# Patient Record
Sex: Female | Born: 1985 | Race: Black or African American | Hispanic: No | Marital: Single
Health system: Southern US, Community
[De-identification: ages and names within clinical notes are randomized; demographics above are authoritative.]

## PROBLEM LIST (undated history)

## (undated) DIAGNOSIS — I82409 Acute embolism and thrombosis of unspecified deep veins of unspecified lower extremity: Secondary | ICD-10-CM

## (undated) DIAGNOSIS — N390 Urinary tract infection, site not specified: Secondary | ICD-10-CM

## (undated) DIAGNOSIS — F259 Schizoaffective disorder, unspecified: Secondary | ICD-10-CM

## (undated) DIAGNOSIS — A419 Sepsis, unspecified organism: Secondary | ICD-10-CM

## (undated) DIAGNOSIS — I513 Intracardiac thrombosis, not elsewhere classified: Secondary | ICD-10-CM

## (undated) DIAGNOSIS — G9340 Encephalopathy, unspecified: Secondary | ICD-10-CM

## (undated) DIAGNOSIS — D649 Anemia, unspecified: Secondary | ICD-10-CM

## (undated) DIAGNOSIS — E119 Type 2 diabetes mellitus without complications: Secondary | ICD-10-CM

## (undated) HISTORY — DX: Urinary tract infection, site not specified: N39.0

## (undated) HISTORY — DX: Anemia, unspecified: D64.9

## (undated) HISTORY — DX: Schizoaffective disorder, unspecified: F25.9

## (undated) HISTORY — DX: Sepsis, unspecified organism: A41.9

## (undated) HISTORY — DX: Encephalopathy, unspecified: G93.40

## (undated) HISTORY — DX: Type 2 diabetes mellitus without complications: E11.9

## (undated) HISTORY — DX: Intracardiac thrombosis, not elsewhere classified: I51.3

## (undated) HISTORY — DX: Acute embolism and thrombosis of unspecified deep veins of unspecified lower extremity: I82.409

---

## 2014-01-07 ENCOUNTER — Inpatient Hospital Stay: Payer: Self-pay | Admitting: Internal Medicine

## 2014-01-07 LAB — COMPREHENSIVE METABOLIC PANEL
ANION GAP: 8 (ref 7–16)
Albumin: 2.6 g/dL — ABNORMAL LOW (ref 3.4–5.0)
Alkaline Phosphatase: 338 U/L — ABNORMAL HIGH
BUN: 18 mg/dL (ref 7–18)
Bilirubin,Total: 0.4 mg/dL (ref 0.2–1.0)
CALCIUM: 10.3 mg/dL — AB (ref 8.5–10.1)
CHLORIDE: 106 mmol/L (ref 98–107)
Co2: 22 mmol/L (ref 21–32)
Creatinine: 0.85 mg/dL (ref 0.60–1.30)
Glucose: 131 mg/dL — ABNORMAL HIGH (ref 65–99)
Osmolality: 276 (ref 275–301)
Potassium: 3.9 mmol/L (ref 3.5–5.1)
SGOT(AST): 71 U/L — ABNORMAL HIGH (ref 15–37)
SGPT (ALT): 64 U/L (ref 12–78)
SODIUM: 136 mmol/L (ref 136–145)
TOTAL PROTEIN: 8.9 g/dL — AB (ref 6.4–8.2)

## 2014-01-07 LAB — SALICYLATE LEVEL: Salicylates, Serum: <1.7 mg/dL

## 2014-01-07 LAB — CBC
HCT: 20.6 % — ABNORMAL LOW (ref 35.0–47.0)
HGB: 6 g/dL — ABNORMAL LOW (ref 12.0–16.0)
MCH: 19.5 pg — ABNORMAL LOW (ref 26.0–34.0)
MCHC: 28.9 g/dL — AB (ref 32.0–36.0)
MCV: 67 fL — AB (ref 80–100)
PLATELETS: 570 10*3/uL — AB (ref 150–440)
RBC: 3.07 10*6/uL — ABNORMAL LOW (ref 3.80–5.20)
RDW: 25.9 % — ABNORMAL HIGH (ref 11.5–14.5)
WBC: 20.4 10*3/uL — AB (ref 3.6–11.0)

## 2014-01-07 LAB — URINALYSIS, COMPLETE
Bacteria: NONE SEEN
Bilirubin,UR: NEGATIVE
Glucose,UR: NEGATIVE mg/dL (ref 0–75)
Ketone: NEGATIVE
NITRITE: NEGATIVE
PH: 6 (ref 4.5–8.0)
RBC,UR: 8 /HPF (ref 0–5)
Specific Gravity: 1.015 (ref 1.003–1.030)
WBC UR: 69 /HPF (ref 0–5)

## 2014-01-07 LAB — DRUG SCREEN, URINE

## 2014-01-07 LAB — ACETAMINOPHEN LEVEL: Acetaminophen: <2 ug/mL

## 2014-01-07 LAB — PREGNANCY, URINE: PREGNANCY TEST, URINE: NEGATIVE m[IU]/mL

## 2014-01-07 LAB — ETHANOL: Ethanol: <3 mg/dL

## 2014-01-07 LAB — LITHIUM LEVEL: Lithium: 1.21 mmol/L — ABNORMAL HIGH

## 2014-01-08 LAB — CBC WITH DIFFERENTIAL/PLATELET
BASOS PCT: 0.3 %
Basophil #: 0 10*3/uL (ref 0.0–0.1)
EOS PCT: 1.9 %
Eosinophil #: 0.3 10*3/uL (ref 0.0–0.7)
HCT: 24.2 % — AB (ref 35.0–47.0)
HGB: 7.5 g/dL — AB (ref 12.0–16.0)
LYMPHS ABS: 2.7 10*3/uL (ref 1.0–3.6)
LYMPHS PCT: 17.2 %
MCH: 22.4 pg — ABNORMAL LOW (ref 26.0–34.0)
MCHC: 31.2 g/dL — AB (ref 32.0–36.0)
MCV: 72 fL — ABNORMAL LOW (ref 80–100)
MONO ABS: 0.8 x10 3/mm (ref 0.2–0.9)
MONOS PCT: 5.4 %
NEUTROS PCT: 75.2 %
Neutrophil #: 11.6 10*3/uL — ABNORMAL HIGH (ref 1.4–6.5)
Platelet: 365 10*3/uL (ref 150–440)
RBC: 3.36 10*6/uL — AB (ref 3.80–5.20)
RDW: 26.7 % — ABNORMAL HIGH (ref 11.5–14.5)
WBC: 15.5 10*3/uL — ABNORMAL HIGH (ref 3.6–11.0)

## 2014-01-08 LAB — IRON AND TIBC
Iron Bind.Cap.(Total): 368 ug/dL (ref 250–450)
Iron Saturation: 7 %
Iron: 25 ug/dL — ABNORMAL LOW (ref 50–170)
UNBOUND IRON-BIND. CAP.: 343 ug/dL

## 2014-01-08 LAB — BASIC METABOLIC PANEL
Anion Gap: 7 (ref 7–16)
BUN: 14 mg/dL (ref 7–18)
CALCIUM: 9.8 mg/dL (ref 8.5–10.1)
Chloride: 108 mmol/L — ABNORMAL HIGH (ref 98–107)
Co2: 22 mmol/L (ref 21–32)
Creatinine: 0.67 mg/dL (ref 0.60–1.30)
GLUCOSE: 135 mg/dL — AB (ref 65–99)
Osmolality: 276 (ref 275–301)
POTASSIUM: 3.6 mmol/L (ref 3.5–5.1)
SODIUM: 137 mmol/L (ref 136–145)

## 2014-01-08 LAB — SEDIMENTATION RATE: Erythrocyte Sed Rate: >140 mm/hr — ABNORMAL HIGH (ref 0–20)

## 2014-01-09 LAB — BASIC METABOLIC PANEL
ANION GAP: 6 — AB (ref 7–16)
BUN: 9 mg/dL (ref 7–18)
CHLORIDE: 108 mmol/L — AB (ref 98–107)
CO2: 20 mmol/L — AB (ref 21–32)
Calcium, Total: 9.4 mg/dL (ref 8.5–10.1)
Creatinine: 0.69 mg/dL (ref 0.60–1.30)
EGFR (African American): >60
EGFR (Non-African Amer.): >60
GLUCOSE: 136 mg/dL — AB (ref 65–99)
OSMOLALITY: 269 (ref 275–301)
POTASSIUM: 3.7 mmol/L (ref 3.5–5.1)
SODIUM: 134 mmol/L — AB (ref 136–145)

## 2014-01-09 LAB — LITHIUM LEVEL: Lithium: 0.52 mmol/L — ABNORMAL LOW

## 2014-01-09 LAB — CLOSTRIDIUM DIFFICILE(ARMC)

## 2014-01-09 LAB — OCCULT BLOOD X 1 CARD TO LAB, STOOL: Occult Blood, Feces: NEGATIVE

## 2014-01-09 LAB — HEPATIC FUNCTION PANEL A (ARMC)
ALK PHOS: 234 U/L — AB
Albumin: 2.1 g/dL — ABNORMAL LOW (ref 3.4–5.0)
Bilirubin,Total: 0.2 mg/dL (ref 0.2–1.0)
SGOT(AST): 39 U/L — ABNORMAL HIGH (ref 15–37)
SGPT (ALT): 72 U/L (ref 12–78)
Total Protein: 7.1 g/dL (ref 6.4–8.2)

## 2014-01-09 LAB — CBC WITH DIFFERENTIAL/PLATELET
BASOS PCT: 0.3 %
Basophil #: 0.1 10*3/uL (ref 0.0–0.1)
Eosinophil #: 0.3 10*3/uL (ref 0.0–0.7)
Eosinophil %: 1.9 %
HCT: 22.3 % — ABNORMAL LOW (ref 35.0–47.0)
HGB: 6.8 g/dL — AB (ref 12.0–16.0)
LYMPHS ABS: 2.6 10*3/uL (ref 1.0–3.6)
Lymphocyte %: 16.9 %
MCH: 22.1 pg — AB (ref 26.0–34.0)
MCHC: 30.7 g/dL — ABNORMAL LOW (ref 32.0–36.0)
MCV: 72 fL — AB (ref 80–100)
MONOS PCT: 6.7 %
Monocyte #: 1 x10 3/mm — ABNORMAL HIGH (ref 0.2–0.9)
Neutrophil #: 11.4 10*3/uL — ABNORMAL HIGH (ref 1.4–6.5)
Neutrophil %: 74.2 %
Platelet: 283 10*3/uL (ref 150–440)
RBC: 3.09 10*6/uL — AB (ref 3.80–5.20)
RDW: 25.8 % — ABNORMAL HIGH (ref 11.5–14.5)
WBC: 15.4 10*3/uL — AB (ref 3.6–11.0)

## 2014-01-10 LAB — CBC WITH DIFFERENTIAL/PLATELET
Basophil #: 0.1 10*3/uL (ref 0.0–0.1)
Basophil %: 0.6 %
EOS ABS: 0.2 10*3/uL (ref 0.0–0.7)
Eosinophil %: 1.4 %
HCT: 25.9 % — AB (ref 35.0–47.0)
HGB: 8 g/dL — ABNORMAL LOW (ref 12.0–16.0)
LYMPHS ABS: 2.7 10*3/uL (ref 1.0–3.6)
LYMPHS PCT: 18.8 %
MCH: 22.9 pg — AB (ref 26.0–34.0)
MCHC: 31.1 g/dL — AB (ref 32.0–36.0)
MCV: 74 fL — AB (ref 80–100)
MONO ABS: 1 x10 3/mm — AB (ref 0.2–0.9)
MONOS PCT: 7.3 %
Neutrophil #: 10.3 10*3/uL — ABNORMAL HIGH (ref 1.4–6.5)
Neutrophil %: 71.9 %
PLATELETS: 295 10*3/uL (ref 150–440)
RBC: 3.51 10*6/uL — ABNORMAL LOW (ref 3.80–5.20)
RDW: 25.7 % — ABNORMAL HIGH (ref 11.5–14.5)
WBC: 14.3 10*3/uL — AB (ref 3.6–11.0)

## 2014-01-10 LAB — CK: CK, Total: 40 U/L

## 2014-01-10 LAB — HEPATIC FUNCTION PANEL A (ARMC)
ALBUMIN: 2.2 g/dL — AB (ref 3.4–5.0)
AST: 77 U/L — AB (ref 15–37)
Alkaline Phosphatase: 222 U/L — ABNORMAL HIGH
BILIRUBIN TOTAL: 0.2 mg/dL (ref 0.2–1.0)
Bilirubin, Direct: <0.1 mg/dL (ref 0.00–0.20)
SGPT (ALT): 94 U/L — ABNORMAL HIGH (ref 12–78)
Total Protein: 7.3 g/dL (ref 6.4–8.2)

## 2014-01-10 LAB — RETICULOCYTES
ABSOLUTE RETIC COUNT: 0.128 10*6/uL (ref 0.019–0.186)
RETICULOCYTE: 2.9 % (ref 0.4–3.1)

## 2014-01-10 LAB — LACTATE DEHYDROGENASE: LDH: 331 U/L — ABNORMAL HIGH (ref 81–246)

## 2014-01-10 LAB — HCG, QUANTITATIVE, PREGNANCY

## 2014-01-10 LAB — AMMONIA: AMMONIA, PLASMA: 34 umol/L — AB (ref 11–32)

## 2014-01-11 DIAGNOSIS — R000 Tachycardia, unspecified: Secondary | ICD-10-CM

## 2014-01-11 LAB — TSH: Thyroid Stimulating Horm: 2.59 u[IU]/mL

## 2014-01-11 LAB — RETICULOCYTES
Absolute Retic Count: 0.125 10*6/uL (ref 0.019–0.186)
Reticulocyte: 3.7 % — ABNORMAL HIGH (ref 0.4–3.1)

## 2014-01-11 LAB — STOOL CULTURE

## 2014-01-11 LAB — AMMONIA: AMMONIA, PLASMA: 25 umol/L (ref 11–32)

## 2014-01-12 DIAGNOSIS — I514 Myocarditis, unspecified: Secondary | ICD-10-CM

## 2014-01-12 LAB — URINALYSIS, COMPLETE
Bilirubin,UR: NEGATIVE
Glucose,UR: NEGATIVE mg/dL (ref 0–75)
Ketone: NEGATIVE
Nitrite: NEGATIVE
PROTEIN: NEGATIVE
Ph: 7 (ref 4.5–8.0)
RBC,UR: 2 /HPF (ref 0–5)
Specific Gravity: 1.001 (ref 1.003–1.030)
Squamous Epithelial: <1
WBC UR: 29 /HPF (ref 0–5)

## 2014-01-12 LAB — CREATININE, SERUM
Creatinine: 0.73 mg/dL (ref 0.60–1.30)
EGFR (African American): >60
EGFR (Non-African Amer.): >60

## 2014-01-12 LAB — PROTIME-INR
INR: 1.2
PROTHROMBIN TIME: 14.9 s — AB (ref 11.5–14.7)

## 2014-01-13 DIAGNOSIS — I38 Endocarditis, valve unspecified: Secondary | ICD-10-CM

## 2014-01-13 LAB — CBC WITH DIFFERENTIAL/PLATELET
BASOS ABS: 0 10*3/uL (ref 0.0–0.1)
Basophil %: 0.4 %
Eosinophil #: 0.2 10*3/uL (ref 0.0–0.7)
Eosinophil %: 2.4 %
HCT: 23.5 % — ABNORMAL LOW (ref 35.0–47.0)
HGB: 7.5 g/dL — AB (ref 12.0–16.0)
Lymphocyte #: 2.8 10*3/uL (ref 1.0–3.6)
Lymphocyte %: 27.8 %
MCH: 23.5 pg — ABNORMAL LOW (ref 26.0–34.0)
MCHC: 32 g/dL (ref 32.0–36.0)
MCV: 74 fL — AB (ref 80–100)
MONO ABS: 0.9 x10 3/mm (ref 0.2–0.9)
Monocyte %: 8.8 %
Neutrophil #: 6 10*3/uL (ref 1.4–6.5)
Neutrophil %: 60.6 %
PLATELETS: 453 10*3/uL — AB (ref 150–440)
RBC: 3.2 10*6/uL — ABNORMAL LOW (ref 3.80–5.20)
RDW: 25.7 % — AB (ref 11.5–14.5)
WBC: 9.9 10*3/uL (ref 3.6–11.0)

## 2014-01-13 LAB — LITHIUM LEVEL: LITHIUM: 0.88 mmol/L

## 2014-01-13 LAB — PROTIME-INR
INR: 1.3
PROTHROMBIN TIME: 15.7 s — AB (ref 11.5–14.7)

## 2014-01-14 LAB — BASIC METABOLIC PANEL
Anion Gap: 8 (ref 7–16)
BUN: 5 mg/dL — AB (ref 7–18)
CALCIUM: 9.9 mg/dL (ref 8.5–10.1)
CREATININE: 0.73 mg/dL (ref 0.60–1.30)
Chloride: 107 mmol/L (ref 98–107)
Co2: 23 mmol/L (ref 21–32)
EGFR (African American): >60
Glucose: 101 mg/dL — ABNORMAL HIGH (ref 65–99)
Osmolality: 273 (ref 275–301)
Potassium: 4 mmol/L (ref 3.5–5.1)
Sodium: 138 mmol/L (ref 136–145)

## 2014-01-14 LAB — MAGNESIUM: Magnesium: 2 mg/dL

## 2014-01-14 LAB — PROTIME-INR
INR: 1.4
Prothrombin Time: 16.9 secs — ABNORMAL HIGH (ref 11.5–14.7)

## 2014-01-14 LAB — HEMOGLOBIN: HGB: 7.3 g/dL — ABNORMAL LOW (ref 12.0–16.0)

## 2014-01-15 LAB — PROT IMMUNOELECTROPHORES(ARMC)

## 2014-01-15 LAB — URINE CULTURE

## 2014-01-15 LAB — PROTIME-INR
INR: 1.7
Prothrombin Time: 19.7 secs — ABNORMAL HIGH (ref 11.5–14.7)

## 2014-01-15 LAB — HEMOGLOBIN: HGB: 7.4 g/dL — ABNORMAL LOW (ref 12.0–16.0)

## 2014-01-16 LAB — PROTIME-INR
INR: 2.5
Prothrombin Time: 26.3 secs — ABNORMAL HIGH (ref 11.5–14.7)

## 2014-01-22 ENCOUNTER — Telehealth: Payer: Self-pay | Admitting: *Deleted

## 2014-01-22 ENCOUNTER — Encounter: Payer: Self-pay | Admitting: *Deleted

## 2014-01-22 NOTE — Telephone Encounter (Signed)
Attempted to contact patient for TCM call  No phone number available

## 2014-01-29 ENCOUNTER — Encounter: Payer: Self-pay | Admitting: *Deleted

## 2014-01-29 ENCOUNTER — Encounter: Payer: Self-pay | Admitting: Cardiovascular Disease

## 2014-01-31 ENCOUNTER — Emergency Department: Payer: Self-pay | Admitting: Emergency Medicine

## 2014-01-31 LAB — CBC WITH DIFFERENTIAL/PLATELET
BASOS PCT: 0.4 %
Basophil #: 0.1 10*3/uL (ref 0.0–0.1)
EOS PCT: 0.4 %
Eosinophil #: 0.1 10*3/uL (ref 0.0–0.7)
HCT: 12.1 % — CL (ref 35.0–47.0)
HGB: 3.6 g/dL — AB (ref 12.0–16.0)
LYMPHS ABS: 3.6 10*3/uL (ref 1.0–3.6)
Lymphocyte %: 22.2 %
MCH: 23.4 pg — ABNORMAL LOW (ref 26.0–34.0)
MCHC: 29.7 g/dL — ABNORMAL LOW (ref 32.0–36.0)
MCV: 79 fL — ABNORMAL LOW (ref 80–100)
Monocyte #: 1.8 x10 3/mm — ABNORMAL HIGH (ref 0.2–0.9)
Monocyte %: 11 %
NEUTROS ABS: 10.6 10*3/uL — AB (ref 1.4–6.5)
NEUTROS PCT: 66 %
Platelet: 528 10*3/uL — ABNORMAL HIGH (ref 150–440)
RBC: 1.54 10*6/uL — ABNORMAL LOW (ref 3.80–5.20)
RDW: 28.5 % — ABNORMAL HIGH (ref 11.5–14.5)
WBC: 16.1 10*3/uL — ABNORMAL HIGH (ref 3.6–11.0)

## 2014-01-31 LAB — COMPREHENSIVE METABOLIC PANEL
ALBUMIN: 1.9 g/dL — AB (ref 3.4–5.0)
ANION GAP: 14 (ref 7–16)
Alkaline Phosphatase: 87 U/L
BUN: 6 mg/dL — ABNORMAL LOW (ref 7–18)
Bilirubin,Total: 0.1 mg/dL — ABNORMAL LOW (ref 0.2–1.0)
CHLORIDE: 105 mmol/L (ref 98–107)
CREATININE: 0.93 mg/dL (ref 0.60–1.30)
Calcium, Total: 8.7 mg/dL (ref 8.5–10.1)
Co2: 15 mmol/L — ABNORMAL LOW (ref 21–32)
EGFR (African American): >60
EGFR (Non-African Amer.): >60
GLUCOSE: 103 mg/dL — AB (ref 65–99)
OSMOLALITY: 266 (ref 275–301)
Potassium: 4.2 mmol/L (ref 3.5–5.1)
SGOT(AST): 26 U/L (ref 15–37)
SGPT (ALT): 14 U/L (ref 12–78)
Sodium: 134 mmol/L — ABNORMAL LOW (ref 136–145)
TOTAL PROTEIN: 4.7 g/dL — AB (ref 6.4–8.2)

## 2014-01-31 LAB — PROTIME-INR
INR: 3.4
PROTHROMBIN TIME: 33.1 s — AB (ref 11.5–14.7)

## 2014-01-31 LAB — HCG, QUANTITATIVE, PREGNANCY

## 2014-02-05 LAB — CULTURE, BLOOD (SINGLE)

## 2014-02-07 LAB — CULTURE, BLOOD (SINGLE)

## 2014-04-18 ENCOUNTER — Emergency Department: Payer: Self-pay | Admitting: Emergency Medicine

## 2014-04-18 LAB — DRUG SCREEN, URINE

## 2014-04-18 LAB — COMPREHENSIVE METABOLIC PANEL
ALT: 36 U/L
ANION GAP: 7 (ref 7–16)
AST: 27 U/L (ref 15–37)
Albumin: 3.8 g/dL (ref 3.4–5.0)
Alkaline Phosphatase: 107 U/L
BUN: 6 mg/dL — ABNORMAL LOW (ref 7–18)
Bilirubin,Total: 0.3 mg/dL (ref 0.2–1.0)
Calcium, Total: 9.9 mg/dL (ref 8.5–10.1)
Chloride: 107 mmol/L (ref 98–107)
Co2: 24 mmol/L (ref 21–32)
Creatinine: 0.82 mg/dL (ref 0.60–1.30)
EGFR (African American): >60
EGFR (Non-African Amer.): >60
Glucose: 92 mg/dL (ref 65–99)
Osmolality: 273 (ref 275–301)
Potassium: 3.6 mmol/L (ref 3.5–5.1)
SODIUM: 138 mmol/L (ref 136–145)
TOTAL PROTEIN: 8 g/dL (ref 6.4–8.2)

## 2014-04-18 LAB — SALICYLATE LEVEL

## 2014-04-18 LAB — URINALYSIS, COMPLETE
BILIRUBIN, UR: NEGATIVE
BLOOD: NEGATIVE
Glucose,UR: NEGATIVE mg/dL (ref 0–75)
Hyaline Cast: 2
Ketone: NEGATIVE
Nitrite: NEGATIVE
Ph: 6 (ref 4.5–8.0)
Protein: 100
RBC,UR: 6 /HPF (ref 0–5)
Specific Gravity: 1.015 (ref 1.003–1.030)
Squamous Epithelial: 7
WBC UR: 15 /HPF (ref 0–5)

## 2014-04-18 LAB — CBC WITH DIFFERENTIAL/PLATELET
Basophil #: 0 10*3/uL (ref 0.0–0.1)
Basophil %: 0.6 %
EOS ABS: 0.1 10*3/uL (ref 0.0–0.7)
Eosinophil %: 1.8 %
HCT: 33.4 % — ABNORMAL LOW (ref 35.0–47.0)
HGB: 11.1 g/dL — ABNORMAL LOW (ref 12.0–16.0)
Lymphocyte #: 1.4 10*3/uL (ref 1.0–3.6)
Lymphocyte %: 21.9 %
MCH: 30.3 pg (ref 26.0–34.0)
MCHC: 33.3 g/dL (ref 32.0–36.0)
MCV: 91 fL (ref 80–100)
MONOS PCT: 12.1 %
Monocyte #: 0.8 x10 3/mm (ref 0.2–0.9)
NEUTROS ABS: 4.2 10*3/uL (ref 1.4–6.5)
Neutrophil %: 63.6 %
Platelet: 478 10*3/uL — ABNORMAL HIGH (ref 150–440)
RBC: 3.67 10*6/uL — AB (ref 3.80–5.20)
RDW: 15.6 % — ABNORMAL HIGH (ref 11.5–14.5)
WBC: 6.5 10*3/uL (ref 3.6–11.0)

## 2014-04-18 LAB — PROTIME-INR
INR: 1
Prothrombin Time: 13 secs (ref 11.5–14.7)

## 2014-04-18 LAB — ACETAMINOPHEN LEVEL: Acetaminophen: <2 ug/mL

## 2014-04-18 LAB — ETHANOL
Ethanol %: <0.003 % (ref 0.000–0.080)
Ethanol: <3 mg/dL

## 2014-04-18 LAB — LITHIUM LEVEL: LITHIUM: 1.03 mmol/L

## 2014-04-24 LAB — HEMOGLOBIN: HGB: 11.1 g/dL — AB (ref 12.0–16.0)

## 2014-04-24 LAB — CREATININE, SERUM
CREATININE: 0.8 mg/dL (ref 0.60–1.30)
EGFR (Non-African Amer.): >60

## 2014-04-24 LAB — LITHIUM LEVEL: Lithium: 1.11 mmol/L

## 2014-12-30 IMAGING — US US EXTREM LOW VENOUS BILAT
1 series · 13 of 24 positions shown · non-contrast
Comparison: None.

CLINICAL DATA: Leg pain.



[Series 1: us extrem low venous bilat · 0.08mm/px · 13 of 80 slices shown]
[im 1/80]
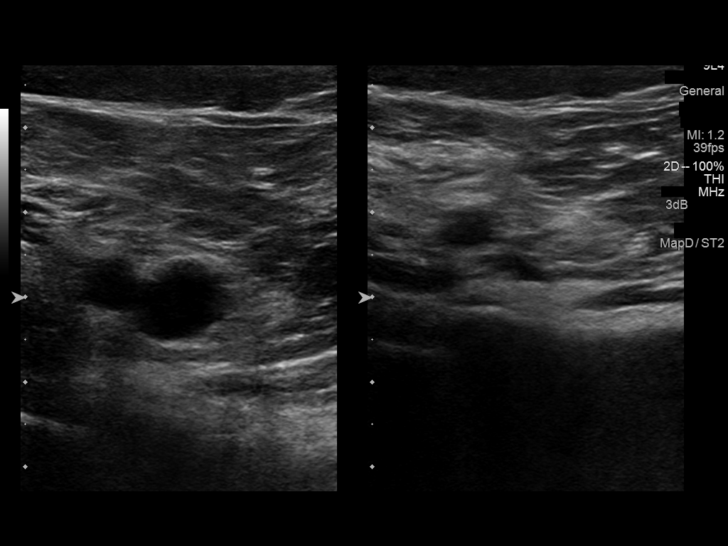
[im 7/80]
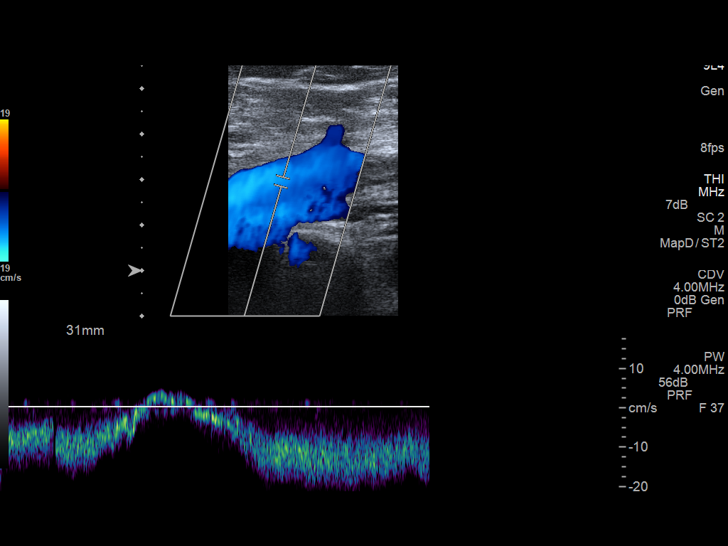
[im 14/80]
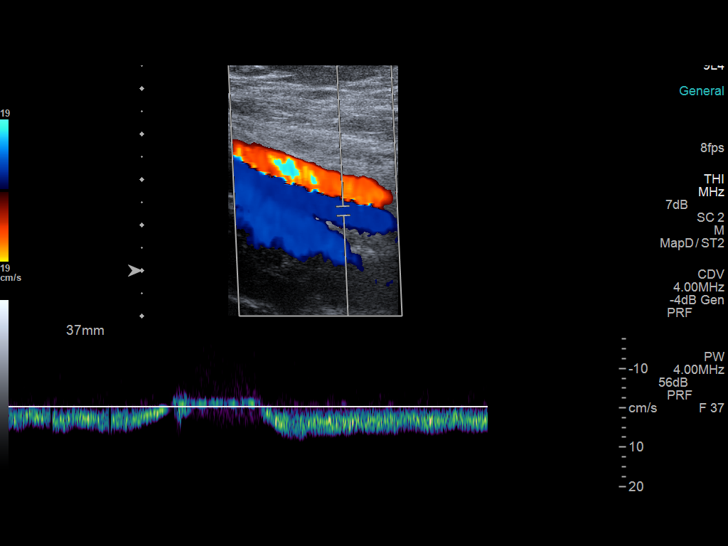
[im 21/80]
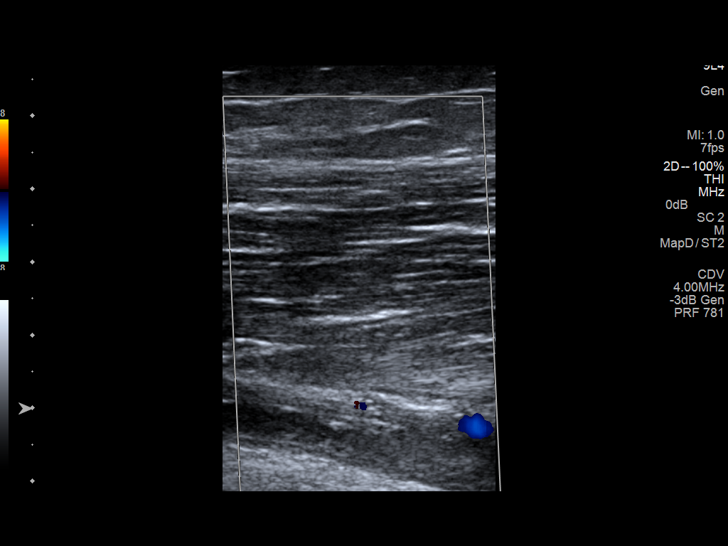
[im 28/80]
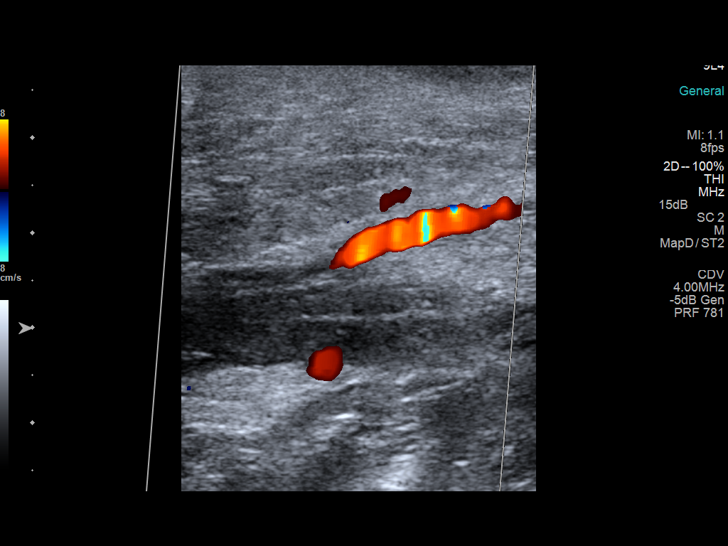
[im 35/80]
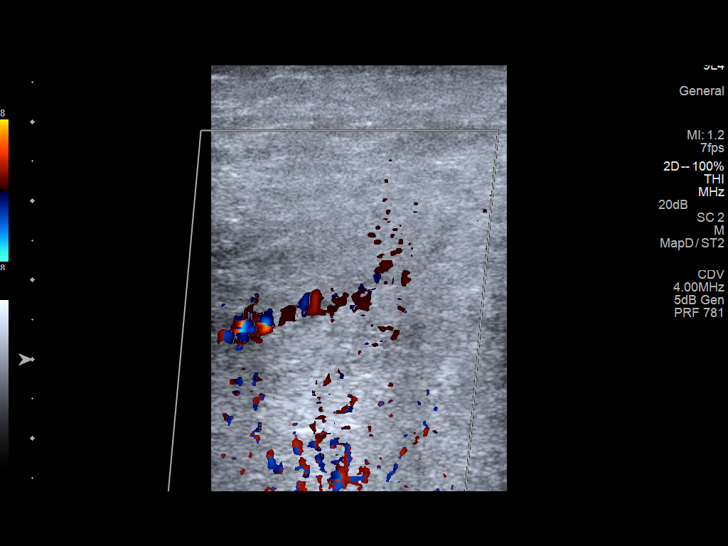
[im 42/80]
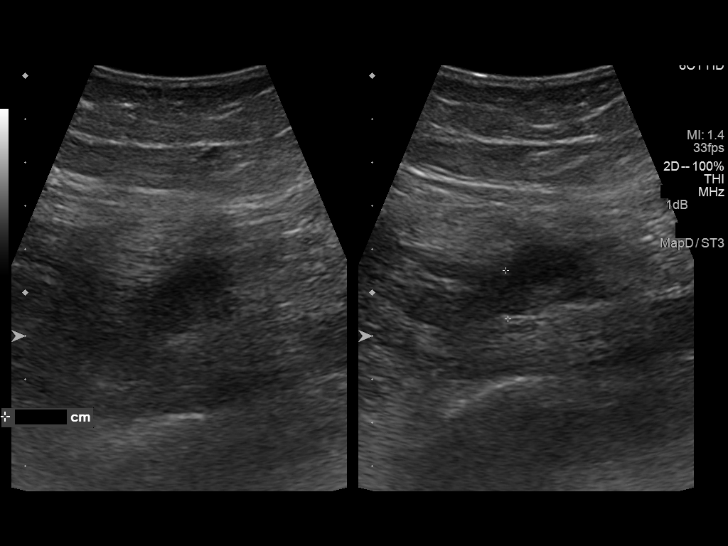
[im 45/80]
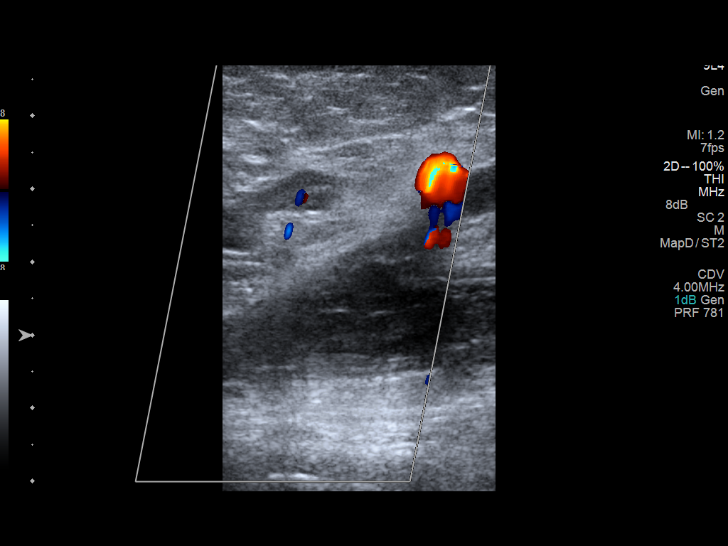
[im 52/80]
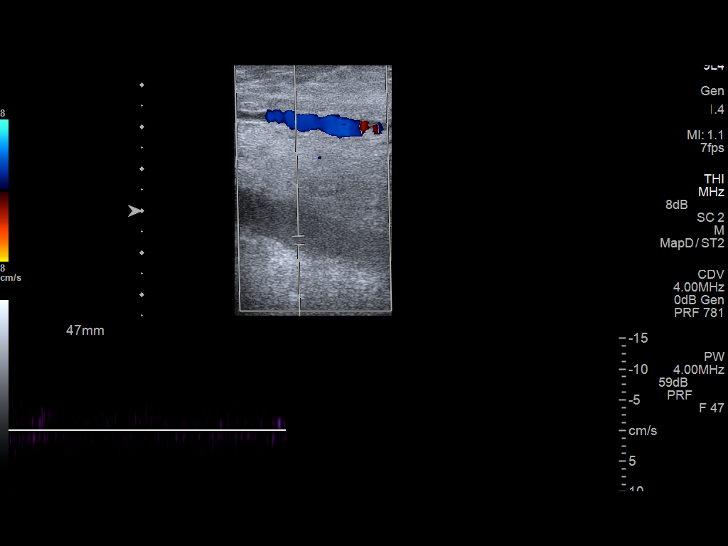
[im 59/80]
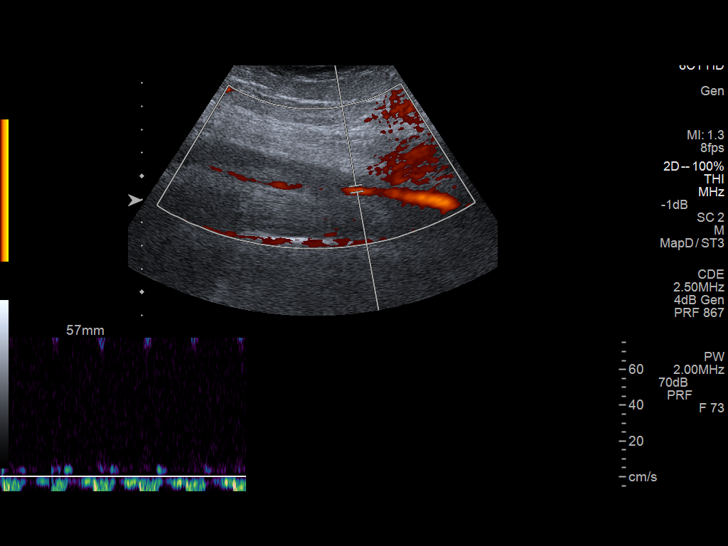
[im 66/80]
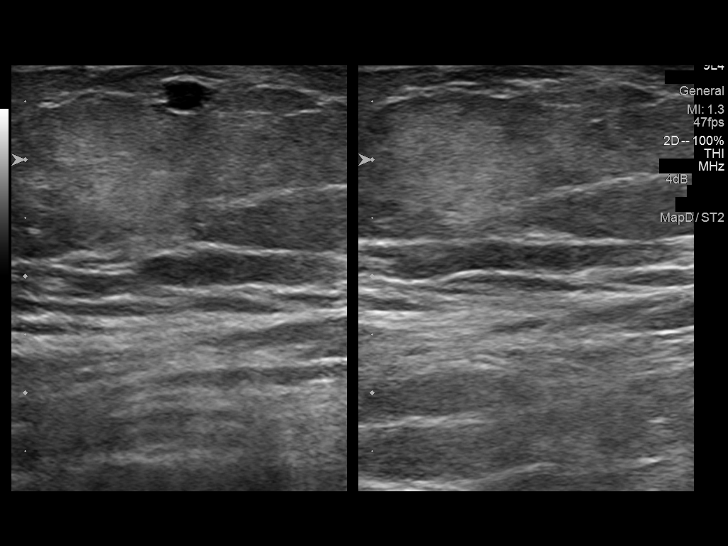
[im 73/80]
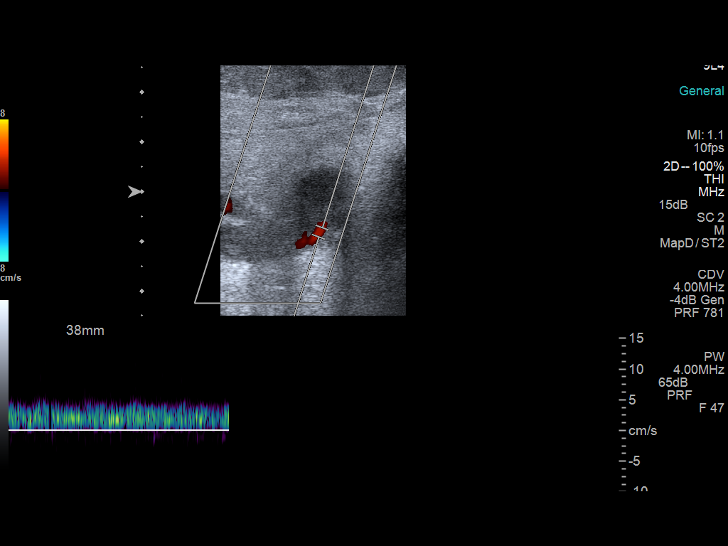
[im 80/80]
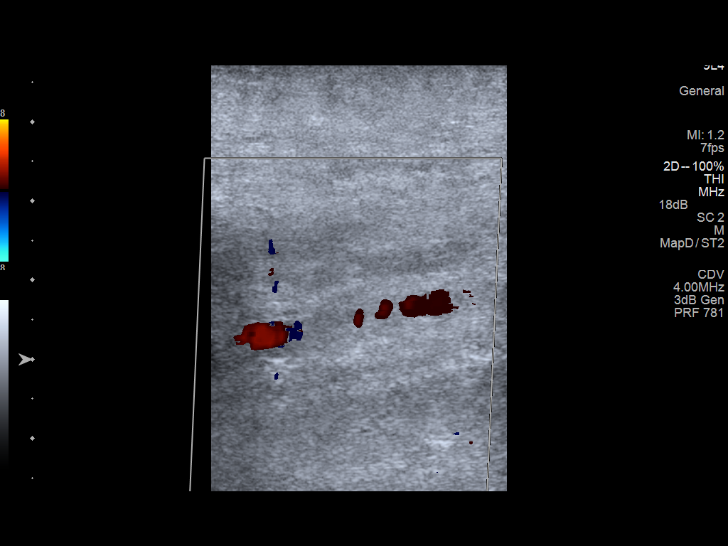

[13 of 24 positions shown; findings below may reference images not displayed]

FINDINGS: RIGHT LOWER EXTREMITY

Common Femoral Vein: No evidence of thrombus. Normal
compressibility, respiratory phasicity.

Saphenofemoral Junction: No evidence of thrombus. Normal
compressibility and flow on color Doppler imaging.

Profunda Femoral Vein: No evidence of thrombus. Normal
compressibility and flow on color Doppler imaging.

Femoral Vein: Occlusive thrombus.

Popliteal Vein: Occlusive thrombus.

Calf Veins: Thrombus in posterior tibial vein. Peroneal vein not
visible.

LEFT LOWER EXTREMITY

Common Femoral Vein: Occlusive thrombus

Saphenofemoral Junction: Occlusive thrombus

Profunda Femoral Vein: Occlusive thrombus

Femoral Vein: Occlusive thrombus

Popliteal Vein: Nonocclusive thrombus.

Calf Veins: Not visible
IMPRESSION: 1. Bilateral occlusive deep vein thrombosis.

## 2015-01-11 NOTE — Op Note (Signed)
PATIENT NAME:  [name], [name], [name]  DATE OF PROCEDURE:  01/31/2014  ATTENDING PHYSICIAN: Dr. Ida Roguehristopher Ladale Sherburn.   PREOPERATIVE DIAGNOSES: Anemia, poor intravenous access.   POSTOPERATIVE DIAGNOSES: Anemia, poor intravenous access.   ANESTHESIA: Lidocaine 1% with epinephrine.   SPECIMENS: None.   ESTIMATED BLOOD LOSS: 5 mL.   COMPLICATIONS: None.   PROCEDURE PERFORMED: Ultrasound-guided right internal jugular central venous catheter placement.   INDICATION FOR PROCEDURE: [name] is a pleasant 29 year old female who presented with 2 weeks of vaginal bleeding. Was noted to be anemic and with only a 22 hand IV. I was, thus, consulted for central venous catheter placement.   DETAILS OF PROCEDURE: As follows: Informed consent was obtained. [name] was laid supine on the ER bed. Her right neck was prepped and draped in standard surgical fashion. A timeout was then performed correctly identifying the patient name, operative site and procedure to be performed. An ultrasound probe was placed over her right internal jugular. It was of good caliber, would easily collapse. I was able to access it with my second stick with the access needle. Wire was placed easily through the needle into the internal jugular vein. The needle was then removed. The tract was dilated and a Triple-lumen catheter was then placed over the wire. The wire was then removed. The catheter was sutured in place. A sterile dressing was placed over the catheter. All three lumens flushed and drew blood well.  An x-ray confirmed that the catheter was in good position without an obvious pneumothorax. The drapes were then taken down. Needle, sponge and instrument counts correct at the end of the procedure.   ____________________________ Si Raiderhristopher A. Tateanna Bach, MD cal:gb D: 01/31/2014 03:36:17 ET T: 01/31/2014 04:02:37 ET JOB#: [id number]  cc: Cristal Deerhristopher A. Drayven Marchena, MD,  <Dictator> Jarvis NewcomerHRISTOPHER A Shandrea Lusk MD ELECTRONICALLY SIGNED 02/08/2014 21:10

## 2015-01-11 NOTE — Consult Note (Signed)
PATIENT NAME:  [name] MR#:  [id number] DATE OF BIRTH:  Jan 16, 1986  DATE OF CONSULTATION:  01/10/2014  REFERRING PHYSICIAN:  Dr. Desiree Boyle CONSULTING PHYSICIAN:  [name]., [name]  REASON FOR CONSULTATION: Elevated sedimentation rate.   HISTORY OF PRESENT ILLNESS: A 29 year old [place] American female who has a history of schizoaffective disorder. She has had home monitors, was on clozapine. She, by her report, awakened about 2 weeks ago with pain and weakness in the legs. She was brought to the hospital, had significantly swollen left leg; did not really want to ambulate because of pain in the leg. She had altered mental status with poor concentration and some lethargy initially. She did not have any significant fever. Has been no headache. She was found to have microcytic anemia, which was new from February with positive stool based on the ER sample. Hemoglobin went down to the 6 range, requiring transfusion.  She had elevated liver functions with alkaline phosphatase at 234. Drug screen was otherwise negative. She had leukocytosis initially and thrombocytosis, which resolved with transfusion. Sedimentation rate was 140 with CRP 61. She had 69 white cells in her urine with 330 of protein. Ultrasound of the legs today showed extensive venous thrombosis bilaterally. Her major complaint is leg pain and she has been holding her left leg in lateral position. She has been mildly tremulous and had some sighing. She is not complaining of shortness of breath or chest pain. She has been started on Lovenox.   On direct questioning, she is evasive.  She denies Raynaud's. She denies pleurisy. Says she gets rashes in the sun but always has a rash around her neck. She has not been short of breath. She has not had hair loss, fingers to swell, joints to swell or oral ulcers.   PAST MEDICAL HISTORY:  Not reliable. Schizoaffective disorder. Denies sickle cell. No family with which to ask questions.  Diabetes.  SOCIAL HISTORY: Smokes.   HOME MEDICATIONS: Metformin 500 two b.i.d., lithium 300 q. a.m. 3 capsules q. p.m., gabapentin 400 mg b.i.d., clonazepam 100 three tablets at bedtime.  REVIEW OF SYSTEMS:  Otherwise unreliable.   PHYSICAL EXAMINATION: GENERAL: The patient is seen slumped over on the side of the bed eating with her plate on her chest. Very poorly cooperative. Did not really want to answer questions or be examined. VITAL SIGNS: Heart rate was 120, blood pressure 96/62, afebrile. Respiratory rate appears to be almost sighing and 18, recent pulse ox was 100. HEENT: Sclerae clear. Oropharynx clear. No thyromegaly. No discoid lesions of the face and hypopigmented lesions of the neck, right lateral face. No alopecia.  CHEST: Clear.  HEART: No significant murmur. Mild abdominal tenderness diffusely but has bowel sounds.  EXTREMITIES: Massively swollen left thigh and left calf. Right calf is mildly swollen.  MUSCULOSKELETAL: Neck and shoulders move well. Hands without synovitis. Hips move well. Knees without effusion. MTPs nontender.  NEUROLOGIC: She is poorly cooperative but seems to have reasonable memory. Diffusely tremulous but with mild clonus-like tremors. Brisk reflexes. Moves all extremities. Mild decreased sensation in the dorsum of the left foot, but good plantar and dorsiflexion with symmetric reflex and with symmetric power.   IMPRESSION: 1.  Bilateral lower extremity thromboses, question hypercoagulability state versus drug related. 2.  Elevated sedimentation rate and CRP without clear historical evidence of a connective tissue disease. Given the multitude of illnesses, raise the question of lupus though she has no historical features to go along with that.  Sedimentation rate  can be elevated with vascular events. Rule out anticoagulant. 3.  Tremor versus myoclonus. Suspect metabolic effect. 4.  Tachycardia, ?etiology 5.  Significant anemia, microcytic. Rule out sickle  disease or thalassemia or hemolysis vs fe loss  RECOMMENDATIONS: 1.  We will follow up sedimentation rate with serum protein, electrophoresis, [name], lupus anticoagulant and anticardiolipin antibody.  2.  Consider ammonia, ABG and thyroid for her tachycardia, tremor and mental status changes.  3.  Consider filter because of her massive deep vein thrombosis and the high risk of pulmonary emboli and the high risk of noncompliance with medicines.  4.  Hematology consult for hypercoagulable state and/or anemia.  Discussed with attending.   ____________________________ [name] ComaGeorge Wallace Kernodle Jr., [name] gwk:ce D: 01/10/2014 18:10:56 ET T: 01/10/2014 20:08:59 ET JOB#: [id number]  cc: [name] ComaGeorge Wallace Kernodle Jr., [name], <Dictator> [name], [name] [name] SilversmithGEORGE W KERNODLE Boyle [name] ELECTRONICALLY SIGNED 01/11/2014 17:12

## 2015-01-11 NOTE — Consult Note (Signed)
PATIENT NAME:  [name], [name] MR#:  [id number] DATE OF BIRTH:  21-Jan-1986  PSYCHIATRY CONSULTATION   DATE OF ADMISSION: 01/07/2014   DATE OF CONSULTATION:  01/08/2014  REFERRING PHYSICIAN:  Hope PigeonVaibhavkumar G. Elisabeth PigeonVachhani, MD CONSULTING PHYSICIAN:  Ardeen FillersUzma S. Garnetta BuddyFaheem, MD  REASON FOR CONSULTATION: Mental status changes.   HISTORY OF PRESENT ILLNESS: The patient is a 29 year old African-American female with history of schizoaffective disorder and followed by psychiatric nurse once per week, admitted to the hospital after she was found in a pool of blood and urine. According to the initial notes obtained from the chart, when the nurse went to see the patient, she did not open the door. The nurse called the EMS, and after opening the door, they found the patient in the bed in her urine and stool. The patient reported that for the last 4 days she was in the bed. She could not get up due to weakness, and her legs hurt. She was unable to provide much of the history. She was brought to the Emergency Room, and her urinalysis was positive, and her hemoglobin was 6, with stools which were guaiac positive. She was noted to be confused, and hospitalist team admitted her for UTI and GI bleed. She was noted to be confused. She kept on mumbling and was only saying that her legs were hurting. She was not aware about the year  and how she ended up in the hospital.   Collateral information was obtained from the patient's primary psychiatrist, Dr. Koleen NimrodBillmire. She reported that the patient has a diagnosis of schizoaffective disorder, and she was following with the ACTT team for the past 1 year. Dr. Koleen NimrodBillmire reported that the patient has been pushing people away for the past 3 to 4 weeks. She was becoming progressively more paranoid. When the EMS came there, she was bleeding from her rectum, and she was noted to have elevated lithium level. Her lithium level at this time is 1.2. Dr. Koleen NimrodBillmire reported that the patient was doing well on  her current combination of psychotropic medications, including Clozaril 300 mg at bedtime, gabapentin 400 mg twice a day and lithium carbonate 450 mg twice a day. She reported that she has titrated the lithium dose to 450 mg in the morning and 2 pills at bedtime in December, when her lithium level was 0.8, due to mood instability. They did the lithium level in December, and it was 0.8 at that time. However, they advised the patient to do a lithium level again in March, and she was given a slip. However, the patient never followed back on her lithium level at that time. Dr. Koleen NimrodBillmire reported that the patient has been stable on Clozaril for at least 1 year, as she has tried several psychotropic medications in the past. She was doing well with the ACTT team, so she decided not to allow them to come into her house on a monthly basis. She was only seeing a nurse practitioner who follows up weekly. The patient has not been in the hospital in the past 18 months. She takes other medications, including metformin and atenolol.   PAST PSYCHIATRIC HISTORY: The patient has been diagnosed with schizoaffective disorder, bipolar type. She is currently stable on her psychotropic medications, including Clozaril, gabapentin and lithium. Her lithium level is currently elevated at 1.2.   PAST MEDICAL HISTORY: Diabetes and hypertension.   SOCIAL HISTORY: The patient currently lives alone. She does not use any drugs or alcohol at this time.   FAMILY HISTORY:  Not known.   CURRENT HOME MEDICATIONS: Clozaril 300 mg p.o. at bedtime, gabapentin 400 mg p.o. b.i.d., lithium carbonate 450 mg p.o. b.i.d., metformin dose unknown and atenolol.    PHYSICAL EXAMINATION:  VITAL SIGNS: Temperature 98.3, pulse 115, respirations 20, blood pressure 119/72.   LABORATORY DATA: Glucose 128, sodium 137, potassium 3.6, BUN 14, creatinine 0.67, bicarbonate 22, anion gap 7, calcium 9.8. Blood alcohol level less than 3. Protein 8.9, albumin 2.6. AST  71, ALT 64. Lithium level 1.21. UDS is currently negative. WBC is 15.5, RBC is 3.36, hemoglobin is 7.5, MCV 72, MCH is 22.4, RDW is 26.7.   MENTAL STATUS EXAMINATION: The patient is a moderately built female who was lying in the bed. She did not open her eyes and did not participate in the psychiatric interview at this time. I tried to awaken the patient, but she did not respond to any of the questions. Most of the history was obtained from her chart.   DIAGNOSTIC IMPRESSION:  AXIS I: Schizoaffective disorder.  AXIS II: None reported.  AXIS III:  1. Please review the medical history, including recent bleeding.  2. Diabetes.  3. Hypertension.   TREATMENT PLAN:  1. The patient admitted to the medical floor due to worsening of her medical conditions, including GI bleed and GI blood loss.  2. Obtained collateral information from Dr. Koleen Nimrod, and I will restart her back on Clozaril as follows: 200 mg p.o. at bedtime.  3. I will also start her on lithium once her lithium level will come back, which was ordered for tomorrow morning. She will be started on lithium 450 mg p.o. at bedtime in 3 days.  4. I will also restart her on gabapentin 100 mg p.o. b.i.d. as she was stable on the current combination of the medications.  5. Will continue to monitor the patient on a regular basis.   Thank you for allowing me to participate in the care of this patient.   ____________________________ Ardeen Fillers. Garnetta Buddy, MD usf:lb D: 01/08/2014 13:11:56 ET T: 01/08/2014 13:32:23 ET JOB#: [id number]  cc: Ardeen Fillers. Garnetta Buddy, MD, <Dictator> Rhunette Croft MD ELECTRONICALLY SIGNED 01/10/2014 18:04

## 2015-01-11 NOTE — Consult Note (Signed)
Brief Consult Note: Diagnosis: Schizoaffective disorder bipolar type.   Patient was seen by consultant.   Recommend further assessment or treatment.   Orders entered.   Discussed with Attending MD.   Comments: [name] has a h/o bipolar including severe manic apisodes. She had been stable on a comb ination of Lithium and Clozapine. She has been on clozapine for over a year, likely close to two years. She was admitted to the hospital for colitis and DVT now thought to result fro Clozapine treatment.  PLAN: 1. We stop Clozapine tonight.  2. Will increase Lithium.  3. There is no substitute for Clozapine. I will talk to Dr. Estill Boyle, her primary psychiatrist.  4. I will follow along.  Electronic Signatures: Kristine LineaPucilowska, Lilas Diefendorf (MD)  (Signed 24-Apr-15 18:34)  Authored: Brief Consult Note   Last Updated: 24-Apr-15 18:34 by Kristine LineaPucilowska, Cionna Collantes (MD)

## 2015-01-11 NOTE — Consult Note (Signed)
PATIENT NAME:  [name], [name]  DATE OF CONSULTATION:  01/31/2014  REFERRING PHYSICIAN:  Dr. Manson PasseyBrown  CONSULTING PHYSICIAN:  [name] Bouchardhomas J. Lakita Sahlin, [name]  HISTORY OF PRESENT ILLNESS:  This is a 29 year old gravida 1, para 1 who presents to the Emergency Department with a reported one month history of vaginal bleeding.  The patient is a poor historian and suffers from schizophrenia.  The patient states that she has had pelvic pain and continuous bleeding prior to this one month episode of bleeding.  She states that she amenorrheic.  The patient does not recall the last time she has had gynecologic care and does not remember when she has had a Pap smear.    PAST MEDICAL HISTORY:  Schizophrenia.   PAST SURGICAL HISTORY:  None.   REVIEW OF SYSTEMS:   Unremarkable.  MEDICATIONS:  Seroquel and lithium.   FAMILY HISTORY:  No gynecologic cancers.   SOCIAL HISTORY:  Smokes no tobacco.  No illicit drugs.   PHYSICAL EXAMINATION: GENERAL:  Well-developed, well-nourished black female in no acute distress.  VITAL SIGNS:  Pulse 136, temperature 97.9.  Blood pressure 116/53.  LUNGS:  Clear to auscultation.  HEART:  Tachycardia without murmur.  ABDOMEN:  Soft, nontender.  PELVIC:  External genitalia, no lesions.  A speculum was placed.  There is a fair amount of serous sanguinous discharge in the vaginal vault.  Necrotic tissue is noted and has been removed previously.  No definitive well circumscribed cervix is identified.  The speculum was removed and a bimanual exam was performed.  There is necrotic cervical tissue with the cervical os opened.  Limited anterior and posterior fornix.  No foreign bodies identified.   ASSESSMENT:   1.  Necrotic cervical tissue concerning for cervical cancer.  2.  Tachycardia secondary to acute blood loss and hypovolemia.   PLAN:  Given the likelihood that this could be cervical cancer I recommend transfer to a tertiary care  where G1 oncology is available.  The patient will receive blood products and intravenous fluid hydration.  Laboratory tests are pending at this point.  Information is passed to Dr. Manson PasseyBrown.      ____________________________ [name] Bouchardhomas J. Isma Tietje, [name] tjs:ea D: 01/31/2014 02:19:48 ET T: 01/31/2014 02:59:52 ET JOB#: [id number]  cc: [name] Bouchardhomas J. Marleny Faller, [name], <Dictator> [name] BouchardHOMAS J Lorayne Getchell [name] ELECTRONICALLY SIGNED 02/08/2014 8:58

## 2015-01-11 NOTE — Consult Note (Signed)
Brief Consult Note: Diagnosis: Schizoaffective disorder bipolar type.   Patient was seen by consultant.   Recommend further assessment or treatment.   Orders entered.   Discussed with Attending MD.   Comments: [name] has a h/o bipolar including severe manic episodes. She had been stable on a combination of Lithium and Clozapine. She has been on clozapine for over a year, likely close to two years. She was admitted to the hospital for colitis and DVT now thought to result from Clozapine treatment. We stopped Clozapine last night. I spoke with Dr. Estill BattenBillmeyer who recommends that we start Seroquel. Spoke with Dr. Imogene Burnhen. The patient produces copious amounts of urine.  PLAN: 1. We start Seroquel XR 150 mg at bedtime.   2. Will continue Lithium 300 mg bid. Level in am.  3. I will follow along..  Electronic Signatures: Kristine LineaPucilowska, Yazmeen Woolf (MD)  (Signed 25-Apr-15 18:37)  Authored: Brief Consult Note   Last Updated: 25-Apr-15 18:37 by Kristine LineaPucilowska, Michel Eskelson (MD)

## 2015-01-11 NOTE — Consult Note (Signed)
PATIENT NAME:  [name], [name] MR#:  [id number] DATE OF BIRTH:  01-Oct-1985  DATE OF CONSULTATION:  01/08/2014  REFERRING PHYSICIAN:  Dr. Anselm Jungling.  CONSULTING PHYSICIAN:  Joelene Millin A. Jerelene Redden, ANP (Adult Nurse Practitioner) and Gaylyn Cheers, M.D.  REASON FOR CONSULTATION: Guaiac- positive stool and hemoglobin of 6.   HISTORY OF PRESENT ILLNESS:  This 29 year old patient with history of psychiatric disorder, is being followed by psychiatric nurse once a week at home. Today, when the patient did not open the door, EMS was called, and they broke into her house and found her lying in bed. The nursing report is that she had been lying there for at least 4 days and was found laying in a  pool of bloody liquid. The patient had told EMS that she had weakness and her legs hurt. She was confused. Laboratory studies found leukocytosis with urinary tract infection and an elevation in her lithium level, and she was heme-positive in the Emergency Room. The patient was admitted for possible GI bleed and urinary tract infection.The patient is unable to provide any further information and admission H`P reveiwed for history details.  No family members present.   PAST MEDICAL HISTORY:  Psychiatric disorder.   SOCIAL HISTORY:  Per admission note. She had reported she is a smoker. Negative alcohol. Lives alone.   FAMILY HISTORY:  Per admission note; family with mental illness.   HOME MEDICATIONS: See admission note. The patient unable to give history.   PHYSICAL EXAMINATION: VITAL SIGNS: 97.3, T-max 99.4, heart rate 114, 18, 104/73, room air oxygenation 100% by pulse ox.    GENERAL: The patient is sleeping, semi-Fowler position, African American female, obese. Dishelveled.  HEENT: Head is normocephalic. Conjunctivae pink.  NECK: With trachea midline. No lymphadenopathy.  CARDIAC: S1 and S2. Tachycardia noted without murmur, rub or gallop.  LUNGS: CTA anterior and posterior. Respirations appear nonlabored.   ABDOMEN: Soft. Bowel sounds present. Appears nontender with palpation.  SKIN: Warm and dry without rash.  EXTREMITIES: Without edema.  PSYCHIATRIC: The patient is sleeping, arouses startled, wild eyed, unable to answer any questions such as name, or place or any pain.The patient has garbled speech, immediately falls to sleep.    REVIEW OF SYSTEMS: Unable to give history.   LABORATORY DATA: Admission blood work with glucose 131, BUN 18, creatinine 0.85. Ethanol is negative. Urine drug screen is negative. Albumin 2.6. Total bilirubin 0.4. Alkaline phosphatase 338, AST 71, ALT 64. Lithium level 1.21. WBC 20.4, hemoglobin 6.0, platelets 570. MCV is 67, MCH 19.5. Urinalysis: Cloudy yellow urine, 1+ blood, 30+ protein, 3+ leukocyte esterase, 69 WBCs per high-power field, 8 RBCs per high-power field, positive WBC clumps and mucus. Acetaminophen less than 2, salicylate serum less than 1.7. Urine pregnancy test is negative.   Repeat laboratory studies today with BUN 14, creatinine 0.67. WBC is 15.5, hemoglobin 7.5, hematocrit 24.2 after 2 units of PRBC.   RADIOLOGY: No imaging studies to report.   ASSESSMENT:  This 29 year old patient brought in by EMS with psychiatric disorder, diabetes, and she presents with confusion and unable to give any verbal history whatsoever. She was reportedly complaining of leg weakness and was to have urinary tract infection, leukocytosis, low hemoglobin and anemia with microcytic, hypochromic  indices. She was heme-positive in the Emergency Room. The patient has subsequently had a large liquid light brown stool in the bed and no evidence of active bleeding at that time. The patient is unable to give any history. No previous records at this facility. Psychiatric  nursing consult has been arranged and pending visitation soon.   Elevation in alkaline phosphatase. Low albumin. Etiology to r/o hepatitis, or non liver cause for elevation in alk phos.    PLAN: 1.  Recommend stool  for C. difficile,  comprehensive culture, O&P with watery light brown stool reported by nursing staff.  2.  Guaiac stools again. I am not certain that she has an active gi bleed bleed as would expect to see evidence of visible blood in her bowel movement if she has active lower GI bleed. Would expect elevation in BUN if she has an acute upper gi bleed. The microcytic indices and the normal BUN sugggests she has chronic anemia. Recommend iron panel, ESR, CRP.  Consider another one unit PRBC transfusion per Dr. Vira Agar recommendation.  3. Etiology of the blood seen at home in her bed and ER finding of heme pos stool to consider hemorrhagic UTI, vaginal bleeding or less likely a lower GI bleeding from Meckel's diverticulum, avms, Dieulafoy's lesion. Consider bleeding scan if patient  re bleeds.  4.With the patient's altered mental status and confusion, rectal exam was not performed at this time  as it is my judgment that that will upset the patient, and I am  not able to get consent for the invasive examination. She is not a luminal candidate until her mental status improves.  5. Recommend repeat liver panel, 5 prime nucleotidase to determine if elevation in alk phos is from her liver and not from bone/intestine. Check viral hepatitis panel.  6. Treat urosepsis. Continue Protonix.  7.  Further GI recommendations pending the patient's clinical course.   This case was discussed with Dr. Vira Agar in collaboration of care.  These services provided by Joelene Millin A. Jerelene Redden, MS, APRN, BC, ANP under collaborative agreement with Gaylyn Cheers.   ____________________________ Janalyn Harder Jerelene Redden, ANP (Adult Nurse Practitioner) kam:dmm D: 01/08/2014 12:24:28 ET T: 01/08/2014 12:54:04 ET JOB#: [id number]  cc: Joelene Millin A. Jerelene Redden, ANP (Adult Nurse Practitioner), <Dictator> Janalyn Harder Sherlyn Hay, MSN, ANP-BC Adult Nurse Practitioner ELECTRONICALLY SIGNED 01/08/2014 14:48

## 2015-01-11 NOTE — Consult Note (Signed)
Brief Consult Note: Diagnosis: Schizoaffective disorder bipolar type.   Patient was seen by consultant.   Recommend further assessment or treatment.   Orders entered.   Discussed with Attending MD.   Comments: [name] has no complaints today. She seems to tolerate new antipsychotics well. She reports interrupted sleep. She likes [name]. There is still polydipsia and polyuria. Li level 0.88 is therapeutic. INR is not therapeutic yet. The patient will be discharged to SNF.  MSE: Alert and oriented. She is much more coherent. ACT team representative visited yesterday and the patient is not much off baseline. She is pleasant and polite. She is excited to go to SNF and is already setting goals. She works with PT and goes to the bathroom independently. Good grooming, good eye contact, good mood, full affects, denies SI/Hi, denies psychotic symptoms. Normal intelligence, fair insight and judgement.  PLAN: 1. We increase Seroquel XR 300 mg at bedtime and increase lauda to 120 mg with supper. There is no substitute for Clozapine. The patient could decompensate.  2. Will continue Lithium 300 mg bid.   3. Spoke with Dr. Estill BattenBillmeyer. Her ACT team will follow up with the patient wherever she goes.   4. I will follow along.  Electronic Signatures: Kristine LineaPucilowska, Jolanta (MD)  (Signed 28-Apr-15 17:34)  Authored: Brief Consult Note   Last Updated: 28-Apr-15 17:34 by Kristine LineaPucilowska, Jolanta (MD)

## 2015-01-11 NOTE — Consult Note (Signed)
Brief Consult Note: Diagnosis: schizoaffective disorder.   Patient was seen by consultant.   Consult note dictated.   Recommend further assessment or treatment.   Orders entered.   Discussed with Attending MD.   Comments: Psychiatry: Patient seen. Patient with schizoaffective disorder is agitated and hyperverbal and vvery disorganized. Li level 1.0. Recent history still being collected. Continue medication as ordered while gathering history and deciding next step.  Electronic Signatures: Audery Amellapacs, John T (MD)  (Signed 30-Jul-15 18:15)  Authored: Brief Consult Note   Last Updated: 30-Jul-15 18:15 by Audery Amellapacs, John T (MD)

## 2015-01-11 NOTE — Consult Note (Signed)
Brief Consult Note: Diagnosis: bilateral femoral popliteal DVT.   Patient was seen by consultant.   Recommend to proceed with surgery or procedure.   Comments: Given recent GI bleed and her psychiatric difficulties anticoagulation does not seem to be realistic I concur with Dr Karrie MeresVichhani that an IVC filter would be a good idea.  Hematology to see her regarding Hypercoagulability Concerns regarding SLE also being addressed.  Electronic Signatures: Levora DredgeSchnier, Gregory (MD)  (Signed 23-Apr-15 19:34)  Authored: Brief Consult Note   Last Updated: 23-Apr-15 19:34 by Levora DredgeSchnier, Gregory (MD)

## 2015-01-11 NOTE — Discharge Summary (Signed)
PATIENT NAME:  [name], SHIPTON MR#:  [id number] DATE OF BIRTH:  04/20/1986  DATE OF ADMISSION:  04/18/2014 DATE OF DISCHARGE:  04/25/2014  HISTORY AND CLINICAL COURSE: This is a 29 year old woman with a history of schizophrenia or schizoaffective disorder. She was brought to the Emergency Room at [facility] on April 18, 2014. She presented with extremely disorganized psychosis, agitation, inability to cooperate with treatment, and inability to communicate. Since being here, she has been treated for her mental illness by resumption of medication that she was on previously. I spoke to Dr. Simonne Maffucci with the [facility] ACT team who helped to guide me with treatment of this patient. The patient has been a client of the [employer] ACT team. Over the last few months she has had a significant decline of both her mental illness and physical illness. She had been at our hospital in the spring for psychosis. Some time after that she was diagnosed with cervical cancer. She was transferred to [facility] where her cervical cancer was evaluated. It is my understanding that a decision was made that they had provided as much treatment as they ethically could. She had been offered palliative care but had not been able to cooperate with it. Again my understanding is that her current cancer prognosis is very grim with an expected short life expectancy. Nevertheless, she has not been having major medical complaints here at the Emergency Room, instead she has been very agitated and psychotic. She is unable to provide any coherent history, even after resumption of her medicine. Her affect is very labile with frequent spells of hysterical laughing or jumping against walls. She has not been assaultive or aggressive and has not tried to hurt anyone. She has been cooperative with her medicine. She was restarted on lithium carbonate at a current dose of 600 mg in the morning and 900 at night. After speaking with Dr.  Simonne Maffucci, I have restarted her on clozapine 250 mg at night. Her lithium level has been good with a most recent check of 1.1 yesterday. I ordered a clozapine level for today, but of course it is not back yet. The ACT team strongly feels that she should be transferred to [facility]. Her psychosis has been intractable with failure to respond even to her current regimen. Between this and her severe medical condition, it is thought best that she be at [facility] where the medical facilities are readily available as well as intensive psychiatric treatment. She has been accepted to [facility] and it is my understanding that she will be transferred today.   MENTAL STATUS AT TRANSFER: Remains disheveled, disorganized, ill kempt. Barely cooperative with an interview. Eye contact intermittent. Psychomotor activity frequently agitated. Speech rambling, often loud. Thoughts do not make any sense. Constant disorganization and flight of ideas, except for the most basic information. Not showing any aggression towards others or self injury. Not able to answer questions about decisions about her medical care. Unable to evaluate cognitive testing any further.   LABORATORY RESULTS: Hemoglobin yesterday 11.1, slightly low. Creatinine currently 0.8. Most recent lithium 1.1. Blood sugars ranging in the low 100s for the most part. The rest of the chemistry panel on admission was normal. The urinalysis showed a likely urinary tract infection. Drug screen positive only for benzodiazepines.   DISCHARGE MEDICATIONS: Clozapine 250 mg at night, docusate 100 mg twice a day, lithium carbonate 600 mg in the morning and 900 mg at night, melatonin 3 mg at night, pantoprazole  40 mg twice a day, Eliquis 5 mg b.i.d., sliding scale insulin coverage, lorazepam 2 mg p.r.n. for agitation every 4 to 6 hours.   ASSESSMENT AND PLAN: The patient will be transferred to [facility] on current medication.   DIAGNOSIS,  PRINCIPAL AND PRIMARY:  AXIS I: Schizoaffective disorder, bipolar type, manic.   SECONDARY DIAGNOSES: AXIS I: No further.  AXIS II: No diagnosis.  AXIS III:  1.  Cervical cancer of an advanced type. 2.  History of deep vein thrombosis, mild sugar dyscontrol, anemia, gastric reflux symptoms.  AXIS IV: Severe from her illness and lack of family social support.  AXIS V: Functioning at time of transfer is 30.  ____________________________ Audery AmelJohn T. Clapacs, MD jtc:sb D: 04/25/2014 10:01:16 ET T: 04/25/2014 10:26:57 ET JOB#: [id number]  cc: Audery AmelJohn T. Clapacs, MD, <Dictator> Audery AmelJOHN T CLAPACS MD ELECTRONICALLY SIGNED 05/16/2014 22:42

## 2015-01-11 NOTE — Consult Note (Signed)
PATIENT NAME:  [name], [name] MR#:  [id number] DATE OF BIRTH:  [birth year]  DATE OF CONSULTATION:  04/19/2014  REFERRING PHYSICIAN:   CONSULTING PHYSICIAN:  Audery AmelJohn T. Clapacs, MD  IDENTIFYING INFORMATION AND REASON FOR CONSULTATION: A 29 year old woman with a history of schizophrenia or schizoaffective disorder, who was brought into the hospital because of agitation, aggression and psychosis at the rehabilitation facility she was discharged to. The patient had no chief complaint.   HISTORY OF PRESENT ILLNESS: Information today was finally obtained from the patient and also from talking with the physician from the [facility] team, who is most familiar with the patient, and from review of the patient's chart. This patient with schizophrenia had been in our hospital for a mental health admission earlier in the spring. After discharge, she returned to the hospital with what turned out to be cervical cancer and was transferred [facility]. She was [facility] at [facility] for some time, but ultimately was discharged to a rehabilitation facility. Her mental illness became overwhelming and she decompensated quickly even though she was on her psychiatric medicine. The patient is able to tell me today, she has bipolar disorder, but is not able to give a more coherent history. According to the [facility] representatives, they feel that the patient and is mentally unstable, and that she has been so difficult to control and has such serious medical problems now that it would be best to try and refer her to [facility]. The patient has not been abusing substances recently and has been getting her medicines for as much as we know.   PAST PSYCHIATRIC HISTORY: Long history of schizophrenia or schizoaffective disorder. Had been stabilized on clozapine. Back a couple of months ago, she developed a deep thrombosis and there was speculation that it could be related to the clozapine. At that time, she was switched  to Seroquel. Dr. Koleen NimrodBillmire tells me that the doctors at [facility] discussed this issue, and felt that given that she is on anticoagulation medicine, that the clozapine was safe, particularly since other antipsychotics have seemed to be ineffective for her. Unknown whether she has a history of serious violence, but she does certainly get agitated when she is psychotic. Unknown about suicidal behavior.   SOCIAL HISTORY: Evidently the family is not very involved. Nevertheless, the patient is still her own legal guardian. She currently has no stable place to live. The [facility] ACT team members are probably the most involved people working for her right now.   PAST MEDICAL HISTORY: I am told that while at [facility], she was diagnosed with cervical cancer, that unfortunately it was diagnosed as a particularly deadly form of cancer. I am told that she received some chemotherapy and radiation, but that it was felt by the oncologists there that they had done all they could. Currently, there is no specific treatment being offered for the cancer. In addition to that, the patient is overweight, has a history of deep vein thrombosis. She is also suffering from gastric reflux symptoms.   CURRENT MEDICATIONS: Clozapine I believe it is 250 mg at night, Ativan q.6 h. p.r.n. for agitation, Eliquis 5 mg b.i.d., pantoprazole 40 mg b.i.d., melatonin 3 mg at night, lithium carbonate 600 mg in the morning and 900 mg at night, docusate 100 mg b.i.d., ciprofloxacin 250 mg q.12 h. for 3 days for a urinary tract infection.   ALLERGIES: No known drug allergies.   SUBSTANCE ABUSE HISTORY: Unknown.   REVIEW OF SYSTEMS:  The patient does not complain of any pain. She does not have any specific physical complaints right now. She is not a very good historian, but she does not appear to be uncomfortable. Denies suicidal ideation. Denies hallucinations.   MENTAL STATUS EXAMINATION: Disheveled woman, whose behavior is intermittently  agitated, but not threatening. She has been flashing [place] with her breasts, but when I go in to talk with her, she is polite and appropriate. Eye contact good. Psychomotor activity, occasionally pacing, but not too agitated. Mood stated as being good. Thoughts are un-lucid are disorganized and scattered. Frequently inappropriate words in response to questions. Denies hallucinations. Denies suicidal or homicidal ideation. Unable to participate in cognitive testing. She does recognize. Dr. Alcide Clever name. When I asked the patient about her cancer, she was unable to give me a coherent reply or discuss it appropriately.   VITAL SIGNS: Currently, blood pressure 144/77, respirations 18, pulse 99, temperature 98.3.   LABORATORY RESULTS: Salicylates and acetaminophen negative. Alcohol negative. Chemistry panel: Low BUN at 6. Pregnancy test negative. Urinalysis was positive for an infection. Drug screen positive for benzodiazepines. PT 13 with an INR of 1.0. Hematocrit low at 33.7, hemoglobin low at 11.1. Lithium level was 1.03.   ASSESSMENT: A 29 year old woman with schizoaffective disorder and recently a diagnosis of cancer. She is psychotic, agitated and disorganized on presentation, although she has calmed down a little bit since yesterday. ACT team, who know her well, strongly feel that she is not going to be able to be managed outside a hospital setting.   TREATMENT PLAN: Restarted the clozapine and lithium, restarted all of her outpatient medicines. Talked about her plan with the ACT team. They would like [place] to refer her to [facility]. We will start the referral to [facility] in the Emergency Room, as it is much easier to do than once she gets admitted to the ward. After we get a response from them, we can decide about the next course of action.   DIAGNOSIS, PRINCIPAL AND PRIMARY:  AXIS I: Schizoaffective disorder, bipolar type, manic.   SECONDARY DIAGNOSES:  AXIS I: No further.  AXIS II:  Deferred.  AXIS III: Cancer, gastric reflux symptoms, overweight.  AXIS IV: Severe stress from all of her illnesses and not having a place to live.  AXIS V: Functioning at time of evaluation is 25.    ____________________________ Audery Amel, MD jtc:jr D: 04/19/2014 17:54:15 ET T: 04/19/2014 18:13:09 ET JOB#: [id number]  cc: Audery Amel, MD, <Dictator> Audery Amel MD ELECTRONICALLY SIGNED 05/16/2014 22:42

## 2015-01-11 NOTE — H&P (Signed)
PATIENT NAME:  [name], [name] MR#:  [id number] DATE OF BIRTH:  28-Oct-1985  DATE OF ADMISSION:  01/07/2014  PRIMARY CARE PHYSICIAN: None.   REFERRING EMERGENCY ROOM PHYSICIAN: Dr. Minna Antis.   CHIEF COMPLAINT:  Leg pain.  HISTORY OF PRESENT ILLNESS: A 29 year old female with psychiatric issues and being followed by psychiatric nurse once a week at home. Today, when the nurse went to home, she did not open the door, so finally nurse called EMS, and after opening the door, they found that she was in the bed with her urine and stool and said that for the last 4 days she was in the bed. She could not get up because of weakness and her legs hurt. She did not give much more history. Brought to the Emergency Room and emergency physician found her urinalysis was positive  and her hemoglobin was 6 with stool guaiac positive. She was a little confused, so given to hospitalist team for admission for UTI and GI bleed. Unable to get much history, as the patient is confused. She kept on saying just a few words and repeating that her legs hurt and asking, "what's next, am I getting admitted or going home," but she is not aware about which year it is and how she ended up with this situation.   REVIEW OF SYSTEMS: Unable to get it, as the patient is confused.   PAST MEDICAL HISTORY:  Unable to get, but appears to be having some psychiatric issues, depending on her previous medical list.   SOCIAL HISTORY: She said that she is a smoker. Denies alcohol or illegal drug use. Lives alone.   FAMILY HISTORY: She is not giving me any much history, but says that family is full of crazy people. I ask about other histories, and she is not able to tell me anything.   HOME MEDICATION: As confirmed by pharmacy technician:  1.  Vitamin D3, 2000 International Units once a day.  2.  Metformin 500 mg oral tablet 2 tablets 2 times a day.  3.  Lithium 300 mg oral tablet once a day in the morning.  4.  Lithium 300 mg 3  capsules once a day at bedtime.  5.  Gabapentin 400 mg oral tablet 2 times a day.  6.  Clonazepam 100 mg oral tablet 3 tablets once a day at bedtime   PHYSICAL EXAMINATION: VITAL SIGNS: In ER, temperature 98.3, pulse 129, respirations 20, blood pressure 109/60. Pulse ox 95 on room air.  GENERAL: The patient is alert but confused. She is oriented only to place, but not time and person.  HEENT:  Head and neck atraumatic. Foul-smelling in the room. Conjunctivae pale. Oral mucosa dry.  NECK: Supple. No JVD.  RESPIRATORY: Bilateral equal and clear air entry.  CARDIOVASCULAR: S1, S2 present, regular, tachycardia present.  ABDOMEN: Soft, nontender. Bowel sounds present. No organomegaly.  SKIN: No rashes.  EXTREMITIES:  Legs: No edema. No tenderness on squeezing calf. Able to move all 4 limbs. No tremor.  PSYCHIATRIC: Currently, she is confused, so unable to assess psychiatric status.   IMPORTANT LABORATORY RESULTS  Glucose 131, BUN 18, creatinine 0.85, sodium 136, potassium 3.9, chloride 106, CO2 of 22. Atenolol less than 3. Calcium is 10.3. Total protein is 8.9, bilirubin 0.4, alkaline phosphatase 338, SGOT 71 and SGPT 64. Lithium is 1.21. Urinalysis is grossly negative. WBC count 20.4, hemoglobin 6. Platelet count is 570 and MCV is 67. Urinalysis is positive with 69 WBCs, 8 RBCs and 3+ leukocyte esterase.  Urine toxicology is negative. Salicylate is less than 1.7. Acetaminophen is less than 2. Pregnancy test: Urine is negative.   ASSESSMENT AND PLAN: A 29 year old female with psychiatric issues and diabetes, brought in with confusion and complaint of leg weakness, found having urinary tract infection and low hemoglobin with guaiac-positive stool.  1. Sepsis with urinary tract infection, as evident by altered mental status, metabolic encephalopathy, leukocytosis, tachycardia. We will treat UTI with Rocephin and get urine culture.  2.  Gastrointestinal bleed, blood loss anemia. Hemoglobin is 6 with  guaiac-positive, as done by ER physician. We will keep her on clear liquid diet. We will give IV fluids and get GI consult for further management of this issue. We will try to avoid all blood thinner medications and NSAIDs.    3.  Altered mental status and confusion, most likely this is due to sepsis. She is also on multiple psychiatric medications. Lithium level is elevated, so I will hold the medications and call psychiatric consult for further management.  4.  Diabetes. Blood sugar is under control, and I will be keeping her on clear liquid diet, so we will just give insulin sliding scale coverage.  5. Tobacco abuse, smoking. The patient is currently confused, so need to be counseled very well once she is oriented.   Total time spent on this admission is 50 minutes.     ____________________________ Hope PigeonVaibhavkumar G. Elisabeth PigeonVachhani, MD vgv:dmm D: 01/07/2014 20:31:00 ET T: 01/07/2014 22:18:56 ET JOB#: [id number]  cc: Hope PigeonVaibhavkumar G. Elisabeth PigeonVachhani, MD, <Dictator> Altamese DillingVAIBHAVKUMAR Juanisha Bautch MD ELECTRONICALLY SIGNED 01/13/2014 13:15

## 2015-01-11 NOTE — Op Note (Signed)
PATIENT NAME:  [name], [name] DATE OF BIRTH:  03/11/1986  DATE OF PROCEDURE:  01/11/2014  PREOPERATIVE DIAGNOSES:  1.  Bilateral femoral popliteal deep venous thrombosis, unprovoked.  2.  Schizophrenia.  3.  Chronic pain syndrome.  4.  Possible systemic lupus erythematosus.   POSTOPERATIVE DIAGNOSES: 1.  Bilateral femoral popliteal deep venous thrombosis, unprovoked.  2.  Schizophrenia.  3.  Chronic pain syndrome.  4.  Possible systemic lupus erythematosus.   PROCEDURES PERFORMED:  1.  Inferior venacavogram.  2.  Placement of an infrarenal inferior vena caval filter, Denali type.   SURGEON: Levora DredgeGregory Nichol Boyle, M.D.   SEDATION: Versed 3 mg plus fentanyl 150 mcg administered IV. Continuous ECG, pulse oximetry and cardiopulmonary monitoring was performed throughout the entire procedure by the interventional radiology nurse. Total sedation time was 30 minutes.   ACCESS: Right common femoral vein.   FLUOROSCOPY TIME: Less than 1 minute.   CONTRAST USED: Isovue 20 mL.   INDICATIONS: [name] is a 29 year old woman with multiple medical problems, who because of her psychiatric illness, is not a compliant patient. She was found to have bilateral leg pain and leg swelling and subsequently duplex ultrasound demonstrated bilateral superficial femoral and popliteal DVT. She was initiated on Lovenox; however, it is felt that she would not be compliant with her medications. Furthermore, she was admitted with a GI bleed and although this has not been a severe problem, clearly long-term anticoagulation would exacerbate this condition as well. For both of these reasons, she is undergoing placement of an infrarenal inferior vena caval filter. Risks and benefits were discussed with the patient. All questions have been answered. The patient agrees to proceed.   DESCRIPTION OF PROCEDURE: The patient is taken to special procedures and placed in the supine position. After adequate sedation  is achieved, right groin is prepped and draped in a sterile fashion. Ultrasound is placed in a sterile sleeve. Common femoral vein is identified. It is echolucent and compressible indicating patency. Image is recorded for the permanent record. Under direct ultrasound visualization, the Seldinger needle is used to access the vein, J-wire is advanced. Counter incision is created. Dilator with marker and the delivery sheath are then advanced over the wire, positioned at the iliac vein confluence and subsequently hand injection of contrast is used to demonstrate the inferior vena cava. Renal blushes appear at the L1 level. Wire is reintroduced and the catheter is advanced up to the L2 level. The wire and dilator are removed and the [facility] filter is advanced through the delivery sheath. [name] PottsDenali is then deployed without difficulty and excellent orientation. The sheath is pulled, pressure is held and there are no immediate complications.   INTERPRETATION: Inferior vena cava is opacified with contrast. The blush from both the right and left renal veins is noted at the L1 level. The cava itself measures approximately 22 mm in diameter. The filter is deployed infrarenally with good result.  ____________________________ [name] DillsGregory G. Yamin Swingler, MD ggs:aw D: 01/11/2014 12:31:19 ET T: 01/11/2014 12:57:51 ET JOB#: [id number]  cc: [name] DillsGregory G. Quin Mathenia, MD, <Dictator> [name] Junobert T. Elliott, MD [name] HashimotoGeorge Wallace Royann ShiversKernodle Jr., MD [name] DillsGREGORY G Rogena Deupree MD ELECTRONICALLY SIGNED 01/22/2014 10:05

## 2015-01-11 NOTE — Consult Note (Signed)
Brief Consult Note: Diagnosis: Heme pos stool, severe microcytic anemia, UTI sepsis, psychiatric diagnosis.   Patient was seen by consultant.   Consult note dictated.   Recommend further assessment or treatment.   Comments: Patient is unable to provide any verbal history as she awakens from sleep, arouses startled, confused, garbled speech and does not answer any questions, and immediately returns to sleep.  Nursing staff report that this has been her baseline since admission. No  facial change of pain  with abdominal palpation.  Nursing staff report one large watery yellow brown stool at shift change. No stool since. No bloody stool or discharge noted. Anemia is likely chronic as evid by normal BUN, microcytic indices, and light brown stool.  Report by home health psychiatric nurse found patient in a pool of liquid blood. There was a question of her starting her menses. She was reported as being in bed for 4 days. Hygiene was poor on admission. Nursing staff have noted no vaginal DC or blood on Depends.   Positive blood in urine.  If patient had a significant GI bleed, would expect to still see blood in recent bowel movement. Hgb up from 6 to 7.5 at 0500 after 2 unite PRBC. Hemoccult positive reported in the ER. PLAN: Continue cbc serially and transfuse as needed. Check iron stores  for evid of chronic anemia.  Close monitoring of vital signs. She is on Rocephin for urinary sepsis. Consider adding IV PPI to cover her for possible UGI blood loss. Stool for C diff, culture, O&P given the watery brown stool reported by staff. Repeat guiac. If she develops active rectal bleeding, consider gi bleed scan. Timing of luminal evaluation per Dr. Mechele CollinElliott after discussing this case with him. Her psychiatric team has been consulted and will see her shortly. No prior hospital records or information is available at this time for review.  Electronic Signatures: Rowan BlaseMills, Deen Deguia Ann (NP)  (Signed 21-Apr-15  12:58)  Authored: Brief Consult Note   Last Updated: 21-Apr-15 12:58 by Rowan BlaseMills, Layken Beg Ann (NP)

## 2015-01-11 NOTE — Consult Note (Signed)
Pt complained of leg pain and she had bilat DVT on Dopplers of legs.  Stool neg for infection, and  also C. diff.  Hgb up to 8 after transfusion.  Dr. Bevelyn BucklesGW Kernodle saw her and is concerned about possible Lupus.  Will get hematology/oncology consults tomorrow and CXR PA and Lat.  She may need CT scans of chest abd pelvis since her history is so hard to explore due to her mental problems.  May benefit from vascular surgery and possible vena cava filter because she will be difficult to manage on anticoagulation medication.  No new GI suggestions.  Electronic Signatures: Scot JunElliott, Robert T (MD)  (Signed on 23-Apr-15 17:48)  Authored  Last Updated: 23-Apr-15 17:48 by Scot JunElliott, Robert T (MD)

## 2015-01-11 NOTE — Consult Note (Signed)
Pt denies signif abd pain, no bowel movement in 2 days.  Vena cava filter placed, leg very swollen.  No tenderness or HSM on palpation of abd.  No bleeding reported since admission.  No hemoglobinopathy on electrophoresis. Heme onc consult pending.  Dr. Bluford Kaufmannh will cover GI this weekend but will not see unless called about a problem.  Needs eventual colonoscopy and EGD.   Electronic Signatures: Scot JunElliott, Eyoel Throgmorton T (MD)  (Signed on 24-Apr-15 16:43)  Authored  Last Updated: 24-Apr-15 16:43 by Scot JunElliott, Jerel Sardina T (MD)

## 2015-01-11 NOTE — Consult Note (Signed)
Chief Complaint:  Subjective/Chief Complaint "My legs hurt really bad."  No bowel movement, abd pain, n/v. Tolerating a regular diet well.   VITAL SIGNS/ANCILLARY NOTES: **Vital Signs.:   22-Apr-15 04:53  Temperature Temperature (F) 98.2  Pulse Pulse 118  Respirations Respirations 18  Systolic BP Systolic BP 159  Diastolic BP (mmHg) Diastolic BP (mmHg) 82  Pulse Ox % Pulse Ox % 99  Oxygen Delivery Room Air/ 21 %    08:38  Vital Signs Type Routine  Temperature Temperature (F) 97.6  Celsius 36.4  Temperature Source oral  Pulse Pulse 110  Respirations Respirations 17  Systolic BP Systolic BP 733  Diastolic BP (mmHg) Diastolic BP (mmHg) 70  Mean BP 81  Pulse Ox % Pulse Ox % 98  Pulse Ox Activity Level  At rest  Oxygen Delivery Room Air/ 21 %   Brief Assessment:  GEN well developed, disheveled   Cardiac Regular  no murmur   Respiratory normal resp effort  clear BS   Gastrointestinal Normal   Gastrointestinal details normal Soft  Nontender   EXTR negative edema   Assessment/Plan:  Assessment/Plan:  Assessment IDA etiology unknown. Patient  does endorse BRBPR for a few days prior to admission with some diarrhea. No specific abdominal pain complaints. Nursing staff has been changing her Depends and no further bleeding noted.  Elevated alk phos with immunity to Hep A and B. Negative HCV. Normal 5 prime nucleotidase- suggests an etiology other than liver. To r/o bone/intestine causes.  Low albumin remaining liver panel normal.  UTI on abx. No supra pubic pain complaints.  Leg pain bilateral generalized without obvious joint swelling/inflammation. . Elevated ESR/CRP- non specific maybe secondary to UTI sepsis or leg pain process. Altered mental status-improved, vague historian Psychiatric disorder- co-operative   Plan Patient declines a colon/egd for now. She may consider it tomorrow. She says that she is not ready for those tests now. She denies prior history of colon/egd.   No stool- still need samples when she has a BM> C diff and other bacterial infection highly unlikely as she has produced no further BMs. Ate normal diet today and tolerated well.  Continure with Protonix.  Consider leg patient work up with rheumatology or orthopedics for leg pain and markedly elev inflammatory markers and alk phos.   Electronic Signatures: Gershon Mussel (NP)  (Signed 22-Apr-15 14:21)  Authored: Chief Complaint, VITAL SIGNS/ANCILLARY NOTES, Brief Assessment, Assessment/Plan   Last Updated: 22-Apr-15 14:21 by Gershon Mussel (NP)

## 2015-01-11 NOTE — Consult Note (Signed)
Brief Consult Note: Diagnosis: SEvere Iron deficiency anemia/Bilateral DVT.   Discussed with Attending MD.   Comments: Severe Microcytic Iron deficency anemia- low Iron and Ferritin. Will plan on Feraheme but will need GI evaluation to rule out blood loss. No evidence of hemolysis. In terms of DVT- possibly related to immobility and or Clozapine-agree with hypercoaguable workup. Also agree with LOvenox and Coumadin. No indication for IVC filter unless patient not able to be on anticoagulation. Patient has significantly raised ESR and tachycardia . Clozapine can cause myocarditis as serositis and psych input in terms of this may be very helpful.Another important consideration would be neuroleptic malignant syndrome..  Electronic Signatures: Georges Mouse (MD)  (Signed 24-Apr-15 13:23)  Authored: Brief Consult Note   Last Updated: 24-Apr-15 13:23 by Georges Mouse (MD)

## 2015-01-11 NOTE — Consult Note (Signed)
Present Illness The patient is a 29 year old female who has a history of schizoaffective disorder. She, by her report, awakened about 2 weeks ago with pain and weakness in the legs. She was brought to the hospital, had significantly swollen left leg; did not really want to ambulate because of pain in the leg. She was found to have anemia, which was new from February with positive stool based on the ER sample. Hemoglobin went down to the 6 range, requiring transfusion.  Ultrasound of the legs today showed extensive venous thrombosis bilaterally. Her major complaint is leg pain.   Home Medications: Medication Instructions Status  cloZAPine 100 mg oral tablet 3 tab(s) orally once a day (at bedtime) Active  lithium 150 mg oral capsule 1 cap(s) orally once a day (in the morning) Active  gabapentin 400 mg oral capsule 1 cap(s) orally 2 times a day Active  lithium 300 mg oral capsule 1 cap(s) orally once a day (in the morning) Active  lithium 300 mg oral capsule 3 cap(s) orally once a day (at bedtime) Active  metFORMIN 500 mg oral tablet 2 tab(s) orally 2 times a day Active  Vitamin D3 2000 intl units oral tablet 1 tab(s) orally once a day Active    No Known Allergies:   Case History:  Family History Non-Contributory   Social History negative tobacco, negative ETOH, negative Illicit drugs   Review of Systems:  Fever/Chills No   Cough No   Sputum No   Abdominal Pain No   Diarrhea No   Constipation No   Nausea/Vomiting No   SOB/DOE No   Chest Pain No   Telemetry Reviewed NSR   Dysuria No   Tolerating PT No  leg pain   Medications/Allergies Reviewed Medications/Allergies reviewed   Physical Exam:  GEN well developed, well nourished   HEENT hearing intact to voice, moist oral mucosa   NECK supple  trachea midline   RESP normal resp effort  no use of accessory muscles   CARD regular rate  no JVD   ABD denies tenderness  nondistended   EXTR positive edema, tender  to palpation bilaterally   SKIN No rashes, No ulcers   NEURO follows commands, motor/sensory function intact   PSYCH alert, poor insight   Nursing/Ancillary Notes: **Vital Signs.:   23-Apr-15 18:45  Vital Signs Type Q 4hr  Temperature Temperature (F) 98.9  Celsius 37.1  Pulse Pulse 120  Respirations Respirations 18  Systolic BP Systolic BP 846  Diastolic BP (mmHg) Diastolic BP (mmHg) 73  Mean BP 85  Pulse Ox % Pulse Ox % 100  Pulse Ox Activity Level  At rest  Oxygen Delivery Room Air/ 21 %   LabObservation:  23-Apr-15 10:31   OBSERVATION Reason for Test Pain  Hepatic:  22-Apr-15 05:55   Bilirubin, Total 0.2  Bilirubin, Direct < 0.1 (Result(s) reported on 09 Jan 2014 at [facility].)  Alkaline Phosphatase  234 (45-117 NOTE: New Reference Range 08/10/13)  SGPT (ALT) 72  SGOT (AST)  39  Total Protein, Serum 7.1  Albumin, Serum  2.1  23-Apr-15 18:53   Bilirubin, Total 0.2  Bilirubin, Direct < 0.1 (Result(s) reported on 10 Jan 2014 at 07:22PM.)  Alkaline Phosphatase  222 (45-117 NOTE: New Reference Range 08/10/13)  SGPT (ALT)  94  SGOT (AST)  77  Total Protein, Serum 7.3  Albumin, Serum  2.2  TDMs:  22-Apr-15 05:55   Lithium, Serum  0.52 (0.60-1.20 TOXIC >= 1.50 mmol/L)  Routine BB:  22-Apr-15 18:08  Crossmatch Unit 1 Transfused  Result(s) reported on 10 Jan 2014 at 07:14AM.  Routine Micro:  22-Apr-15 18:05   Micro Text Report STOOL COMPREHENSIVE   COMMENT                   NO SALMONELLA OR SHIGELLA ISOLATED   COMMENT                   NO PATHOGENIC E.COLI DETECTED   COMMENT                   NO CAMPYLOBACTER ANTIGEN DETECTED   ANTIBIOTIC                        Micro Text Report CLOSTRIDIUM DIFFICILE   C.DIFFICILE ANTIGEN       C.DIFFICILE GDH ANTIGEN : NEGATIVE   C.DIFFICILE TOXIN A/B     C.DIFFICILE TOXINS A AND B : NEGATIVE   INTERPRETATION            Negative for C. difficile.    ANTIBIOTIC                        Culture Comment NO SALMONELLA OR  SHIGELLA ISOLATED  Culture Comment . NO PATHOGENIC E.COLI DETECTED  Culture Comment    . NO CAMPYLOBACTER ANTIGEN DETECTED  Result(s) reported on 11 Jan 2014 at 10:32AM.  Routine Chem:  22-Apr-15 05:55   Glucose, Serum  136  BUN 9  Creatinine (comp) 0.69  Sodium, Serum  134  Potassium, Serum 3.7  Chloride, Serum  108  CO2, Serum  20  Calcium (Total), Serum 9.4  Anion Gap  6  Osmolality (calc) 269  eGFR (African American) >60  eGFR (Non-African American) >60 (eGFR values <8m/min/1.73 m2 may be an indication of chronic kidney disease (CKD). Calculated eGFR is useful in patients with stable renal function. The eGFR calculation will not be reliable in acutely ill patients when serum creatinine is changing rapidly. It is not useful in  patients on dialysis. The eGFR calculation may not be applicable to patients at the low and high extremes of body sizes, pregnant women, and vegetarians.)  23-Apr-15 05:08   HCG Betasubunit Quant. Serum  < 1 (1-3  (International Unit)  ----------------- Non-pregnant <5 Weeks Post LMP mIU/mL  3- 4 wk 9 - 130  4- 5 wk 75 - 2,600  5- 6 wk 850 - 20,800  6- 7 wk 4,000 - 100,000  7-12 wk 11,500 - 289,000 12-16 wk 18,000 - 137,000 16-29 wk 1,400 - 53,000 29-41 wk 940 - 60,000)    18:53   Ammonia, Plasma  34 (Result(s) reported on 10 Jan 2014 at 07:31PM.)  LDH, Serum  331 (Result(s) reported on 10 Jan 2014 at 07:22PM.)  Cardiac:  23-Apr-15 05:08   CK, Total 40 (26-192 NOTE: NEW REFERENCE RANGE  10/22/2013)  Routine Sero:  22-Apr-15 18:05   Occult Blood, Feces NEGATIVE (Result(s) reported on 09 Jan 2014 at 10:30PM.)  Routine Hem:  22-Apr-15 05:55   WBC (CBC)  15.4  RBC (CBC)  3.09  Hemoglobin (CBC)  6.8  Hematocrit (CBC)  22.3  Platelet Count (CBC) 283  MCV  72  MCH  22.1  MCHC  30.7  RDW  25.8  Neutrophil % 74.2  Lymphocyte % 16.9  Monocyte % 6.7  Eosinophil % 1.9  Basophil % 0.3  Neutrophil #  11.4  Lymphocyte # 2.6  Monocyte #  1.0  Eosinophil # 0.3  Basophil # 0.1 (Result(s) reported on 09 Jan 2014 at 06:18AM.)  23-Apr-15 05:08   WBC (CBC)  14.3  RBC (CBC)  3.51  Hemoglobin (CBC)  8.0  Hematocrit (CBC)  25.9  Platelet Count (CBC) 295  MCV  74  MCH  22.9  MCHC  31.1  RDW  25.7  Neutrophil % 71.9  Lymphocyte % 18.8  Monocyte % 7.3  Eosinophil % 1.4  Basophil % 0.6  Neutrophil #  10.3  Lymphocyte # 2.7  Monocyte #  1.0  Eosinophil # 0.2  Basophil # 0.1 (Result(s) reported on 10 Jan 2014 at 05:54AM.)    18:53   Retic Count 2.9  Absolute Retic Count 0.128 (Result(s) reported on 10 Jan 2014 at 09:09PM.)   [place]:    23-Apr-15 10:31, [place] Color Flow Doppler Low Extrem Bilat (Legs)  [place] Color Flow Doppler Low Extrem Bilat (Legs)   REASON FOR EXAM:    Pain  COMMENTS:       PROCEDURE: [place]  - US DOPPLER LOW EXTR BILATERAL  - Jan 10 2014 10:31AM     CLINICAL DATA:  Leg pain.    EXAM:  BILATERAL LOWER EXTREMITY VENOUS DOPPLER ULTRASOUND    TECHNIQUE:  Gray-scale sonography with graded compression, as well as color  Doppler and duplex ultrasound were performed to evaluate the lower  extremity deep venous systems from the level of the common femoral  vein and including the common femoral, femoral, profunda femoral,  popliteal and calf veins including the posterior tibial, peroneal  and gastrocnemius veins when visible. The superficial great  saphenous vein was also interrogated. Spectral Doppler was utilized  to evaluate flow at rest and with distal augmentation maneuvers in  the common femoral, femoral and popliteal veins.    COMPARISON:  None.    FINDINGS:  RIGHT LOWER EXTREMITY    Common Femoral Vein: No evidence of thrombus. Normal  compressibility, respiratory phasicity.    Saphenofemoral Junction: No evidence of thrombus. Normal  compressibility and flow on color Doppler imaging.    Profunda Femoral Vein: No evidence of thrombus. Normal  compressibility and flow on color Doppler  imaging.    Femoral Vein: Occlusive thrombus.    Popliteal Vein: Occlusive thrombus.    Calf Veins: Thrombus in posterior tibial vein. Peroneal vein not  visible.    LEFT LOWER EXTREMITY    Common Femoral Vein: Occlusive thrombus  Saphenofemoral Junction: Occlusive thrombus    Profunda Femoral Vein: Occlusive thrombus    Femoral Vein:Occlusive thrombus    Popliteal Vein: Nonocclusive thrombus.    Calf Veins: Not visible     IMPRESSION:    1. Bilateral occlusive deep vein thrombosis.    Electronically Signed    By: Sherryl Barters M.D.    On: 01/10/2014 10:36         Verified DX:IPJASN DJaneece Fitting, M.D.,    Impression ASSESSMENT AND PLAN: A 29 year old female with psychiatric issues and diabetes, brought in with confusion and complaint of leg pain and weakness, found to have anemia with guaiac-positive stool- By ER on rectal exam.  * DVT- B/l extensive DVT.    Hypercoagulable state is likely  Auto immune in setting of ESR high is a consideration    Given her GI bleed with anemia and her psychiatric issues compliance with anticoagulation is unlikely.    Agree wit IVC filter placement which will be scheduled for tomorrow. *  Acute encephalopathy   possibly due to uti ,  possible lithium toxicity.  conitnue to monitor mental status - may be component of underlying psych disease also. Ordered Ammonia and LFT today.   Psychiatry on consult *  Gastrointestinal bleed  Stool guiac study + initially in association with Hgb of 6  currently she is s/p trasnfsuion GI on consult * Anemia- s/p 2 unit transfusion- Hb came up to  after 2 unit transfusion.  * UTI- Rocephin. *  schizo affective disorder lithius on hold appreciate psych eval. Now level is low- may restart if psych wish.    had a discussion with her out pt psych facility also about her previous labs- as per them she had hb 11 in feb 2015- droped to 7.5 in march. platelets 600 in March and WBC 15000 in March. *  Diabetes.  Blood sugar ssi for now * Tobacco abuse,  nicotine patch to be started   Plan level 4 consult   Electronic Signatures: Hortencia Pilar (MD)  (Signed 24-Apr-15 13:42)  Authored: General Aspect/Present Illness, Home Medications, Allergies, History and Physical Exam, Vital Signs, Labs, Radiology, Impression/Plan   Last Updated: 24-Apr-15 13:42 by Hortencia Pilar (MD)

## 2015-01-11 NOTE — Consult Note (Signed)
Brief Consult Note: Diagnosis: Schizoaffective disorder bipolar type.   Patient was seen by consultant.   Recommend further assessment or treatment.   Orders entered.   Discussed with Attending MD.   Comments: [name] has no complaints today. She tolerates new regimen well. She is eager to leave the hospital. No physical complaints.   MSE: Alert and oriented. Pleasant, polite. Normal speech, good eye contact. Good mood, full affect. Good grooming. Denies SI/HI, denies psychotic symptoms. Normal intelligence, fair insight and judgement.  PLAN: 1. Will continue Seroquel XR 300 mg at bedtime and Lauda to 120 mg with supper in an attempt to substitute for Clozapine that was discontinued due to DVT and colitis.   2. Will continue Lithium 300 mg bid.   3. Spoke with Dr. Estill BattenBillmeyer. Her ACT team will follow up with the patient wherever she goes.  Electronic Signatures: Kristine LineaPucilowska, Kamera Dubas (MD)  (Signed 29-Apr-15 23:21)  Authored: Brief Consult Note   Last Updated: 29-Apr-15 23:21 by Kristine LineaPucilowska, Raji Glinski (MD)

## 2015-01-11 NOTE — Consult Note (Signed)
PATIENT NAME:  [name] FoleySTEWART, Matia MR#:  [id number] DATE OF BIRTH:  May 16, 1986  DATE OF CONSULTATION:  04/21/2014  CONSULTING PHYSICIAN:  Emina Ribaudo K. Aanchal Cope, MD  SEX: Female.  RACE: African American.  SUBJECTIVE: The patient was seen in [facility]. The patient is a 29 year old [place] female who is a poor historian. According to information obtained from the staff, the patient had been living at [facility] for quite some time. The patient said she is 29 years old when records say she is 29 years old. She started laughing when questions are asked. She said she was married for 10 years and does not live with the husband and she said she was admitted to hospitals like this before.  ALCOHOL AND DRUGS: Denied.  MENTAL STATUS EXAMINATION: The patient is very disheveled in appearance and staff report that she urinated in her bed twice last night. The patient reports that the reason why she came here is to "pee in peace" and started laughing when she talked about the same. She knew this was August of 2015 and she could not give the name of the exact days. When she was asked if she was paranoid she asked the staff member, "Do you think I'm paranoid, you are giving me my medicine, aren't you?" and she again stated to the undersigned, "When you come to my house, you bring the clothes". Appeared to be responding to internal stimuli. Staff report that she had been turning naked and flashing to other people around, laughing inappropriately, gets agitated , and behavior is very difficult to redirect. Insight and judgment impaired, poor impulse control.  IMPRESSION: Schizoaffective disorder with psychosis.  RECOMMENDATION: Give patient p.r.n. medications that is Geodon 10 mg and  p.r.n. medications as needed. The patient needs higher level of care at a highly structured institution [facility] and patient will be considered for placement.     ____________________________ Jannet MantisSurya K. Guss Bundehalla, MD skc:lt D: 04/21/2014 17:59:41 ET T: 04/21/2014 19:36:11 ET JOB#: [id number]  cc: Monika SalkSurya K. Guss Bundehalla, MD, <Dictator> Beau FannySURYA K Lolitha Tortora MD ELECTRONICALLY SIGNED 04/22/2014 9:25

## 2015-01-11 NOTE — Consult Note (Signed)
Brief Consult Note: Diagnosis: Schizoaffective disorder bipolar type.   Patient was seen by consultant.   Recommend further assessment or treatment.   Orders entered.   Discussed with Attending MD.   Comments: [name] has no complaints today. She seems to tolerate new antipsychotics well. She reports some sleep from seroquel. She likes [name]. Nursing staff reports polydipsia and polyuria. Li level 0.88 is therapeutic.   MSE: Alert and oriented but asking the same question over and over. Pleasant and polite with me but a handful per nursing staff. Marginal grooming, good eye contact, good mood, full affects, denies SI/Hi, denies psychotic symptoms. Normal intelligence, fair insight and judgement.  PLAN: 1. We continue Seroquel XR 150 mg at bedtime and increase lauda to 80 mg with supper.   2. Will continue Lithium 300 mg bid.   3. I will follow along.  Electronic Signatures: [name], [name] (MD)  (Signed 27-Apr-15 06:12)  Authored: Brief Consult Note   Last Updated: 27-Apr-15 06:12 by [name], [name] (MD)

## 2015-01-11 NOTE — Discharge Summary (Signed)
PATIENT NAME:  [name], [name] MR#:  [id number] DATE OF BIRTH:  1985-11-05  DATE OF ADMISSION:  01/07/2014 DATE OF DISCHARGE:  01/16/2014   PRIMARY CARE PHYSICIAN: Nonlocal.  DISCHARGE DIAGNOSES: 1. Acute encephalopathy, possibly due to urinary tract infection and sepsis.  2. Bilateral lower extremity extensive deep venous thrombosis.  3. Right atrial thrombus.  4. Anemia.  5. Urinary tract infection.  6. Sepsis.  7. Diabetes.  8. Schizoaffective disorder. 9. Tobacco abuse.  CONSULTATION:  1. GI, Dr. Mechele Collin.   2. Hematology, Dr. Wendie Simmer. 3. Psychiatry, Dr. Jennet Maduro. 4. Vascular surgery, Dr. Gilda Crease.  5. Cardiology, Dr. Kirke Corin.   PROCEDURE: Inferior vena cava filter.  CONDITION: Stable.   CODE STATUS: Full code.   HOME MEDICATIONS: Please refer to the medication reconciliation list.   STOPPED MEDICATIONS: Clozapine 100 mg p.o. 3 times once a day at bedtime.   DIET: Low fat, low cholesterol, ADA diet.   ACTIVITY: As tolerated.   FOLLOWUP CARE:  1. Follow up with PCP within 1 to 2 weeks.  2. Follow up with Dr. Kirke Corin within 1 to 2 weeks.  3. Follow up with Dr. Mechele Collin within 1 to 2 weeks for possible endoscopy.  4. The patient also needs a followup INR and hemoglobin as recommended by pharmacy.  5. The patient's ACT team will follow up with the patient.   REASON FOR ADMISSION: Leg pain.   HOSPITAL COURSE: The patient is a 29 year old African-American female with a psychiatric issue history, who was noticed to be on the floor at home with urine and stool, in bed for about 4 days. The patient could not get up because of weakness and leg pain, so the patient was sent to ED. She was noticed to have a low hemoglobin at 6, and stool guaiac was positive. The patient also has a UTI. For detailed history and physical examination, please refer to the admission note dictated by Dr. Elisabeth Pigeon. Laboratory data on admission date showed glucose 131, BUN 18, creatinine 0.85, sodium 136,  potassium 3.9, chloride 106, magnesium 1.21. Urinalysis is positive for WBC of 69. CBC showed WBC 20.4, hemoglobin 6, platelets 570.    1. Sepsis with urinary tract infection. After admission, the patient has been treated with Rocephin. Urine culture showed Enterococcus faecalis sensitive to Rocephin.  After antibiotic treatment, the patient's WBC decreased to normal range.   2. Acute encephalopathy, possibly due to sepsis and UTI. Her mental status improved after antibiotic treatment. 3. Anemia with positive stool occult. After admission, the patient got a blood transfusion 2 units. Hemoglobin increased to 8.0, and the last hemoglobin is 7.4. The patient has no active bleeding.  4. Bilateral lower extremity extensive deep vein thrombosis. Since the patient complained of leg pain, venous duplex was done, which showed bilateral extensive DVT. Dr. Wendie Simmer, hematologist, evaluated the patient and suggested the patient's DVT is possibly due to side effect of clozapine. She suggested to discontinue clozapine and start anticoagulation. In addition, Dr. Gilda Crease, vascular surgeon, evaluated the patient and put an IVC filter. The patient has been on Lovenox and Coumadin. The patient's INR increased to 2.5 today. We will discontinue Lovenox and continue Coumadin. Follow up INR as outpatient.  5. Right atrial thrombus. The patient got an echocardiogram, which showed right atrial thrombus. We requested a cardiology consult. Dr. Kirke Corin suggested to continue anticoagulation with Coumadin and repeat echocardiograph in 3 months.  6. Stool occult positive with questionable gastrointestinal bleeding. The patient's repeated stool occult was negative. The patient got a  blood transfusion, but no active bleeding. Dr. Mechele CollinElliott suggested may get endoscopy in the future.  7. Schizoaffective disorder. Dr. Jennet MaduroPucilowska evaluated the patient, discontinued clozapine and started lithium and Seroquel. The patient ACT team will follow up the  patient.  8. Diabetes. The patient's blood sugar has been controlled with sliding scale.  9. Tobacco abuse. The patient was counseled for smoking cessation. Nicotine patch was given.  DISPOSITION: According to physical therapy evaluation, the patient needs skilled nursing facility placement. The patient has no complaints today. Vital signs are stable. Physical examination is unremarkable. She is clinically stable and will be discharged to skilled nursing facility today. I discussed the patient's discharge plan with the patient, Dr. Jennet MaduroPucilowska and nurse, social worker and Dr. Mariah MillingGollan.   TIME SPENT: About 45 minutes.   ____________________________ Shaune PollackQing Rahcel Shutes, MD qc:lb D: 01/16/2014 11:46:41 ET T: 01/16/2014 12:28:50 ET JOB#: [id number]  cc: Shaune PollackQing Mills Mitton, MD, <Dictator> Shaune PollackQING Cj Edgell MD ELECTRONICALLY SIGNED 01/16/2014 15:20

## 2015-01-11 NOTE — Consult Note (Signed)
Would continue PPI bid for 2 months at least and then decrease to one a day indefinitely  Electronic Signatures: Scot JunElliott, Robert T (MD)  (Signed on 27-Apr-15 17:54)  Authored  Last Updated: 27-Apr-15 17:54 by Scot JunElliott, Robert T (MD)

## 2015-01-11 NOTE — Consult Note (Signed)
Pt admitted for anemia, possible bleeding, altered mental status, UTI.  Much more awake and alert and answering questions appropriately at this time.  Abd not tender at this time.  Continue antibiotics for UTI.  Severe iron def anemia of unknown cause.  Last reported stool was watery and yellow.  She would have to improve a great deal for any endoscopic procedures for evaluation of source of bleeding and anemia.  Electronic Signatures: Scot JunElliott, Robert T (MD)  (Signed on 21-Apr-15 17:35)  Authored  Last Updated: 21-Apr-15 17:35 by Scot JunElliott, Robert T (MD)

## 2015-01-11 NOTE — Consult Note (Signed)
Brief Consult Note: Diagnosis: right atrial thrombus likely due to migration from DVT.   Patient was seen by consultant.   Consult note dictated.   Comments: Continue anticoagualtion. Repeat echo in few months to ensure resolution.  Risks of catheter directed intervention outweight benefits.  Electronic Signatures: Lorine BearsArida, Muhammad (MD)  (Signed 26-Apr-15 11:01)  Authored: Brief Consult Note   Last Updated: 26-Apr-15 11:01 by Lorine BearsArida, Muhammad (MD)

## 2015-01-11 NOTE — Consult Note (Signed)
PATIENT NAME:  [name] Boyle, [name] MR#:  [id number] DATE OF BIRTH:  Jul 30, 1986  DATE OF CONSULTATION:  01/13/2014  REFERRING PHYSICIAN:  Dr. Imogene Burnhen. CONSULTING PHYSICIAN:  Muhammad A. Kirke CorinArida, MD  REASON FOR CONSULTATION: Right atrial thrombus.   HISTORY OF PRESENT ILLNESS: This is a 29 year old African American female with history of schizoaffective disorder. She presented on April 20th with left leg pain. She was diagnosed with extensive bilateral deep vein thrombosis. She also had urinary tract infection and possible GI bleed. She was started on anticoagulation. She ultimately underwent IVC placement. Hypercoagulable work-up is pending. She underwent an echocardiogram yesterday which showed normal LV systolic function. There was possible right atrial thrombus. The study overall was suboptimal as the patient was not cooperative at the time of performing echocardiogram. She currently denies chest pain or dyspnea. Main complaint is left leg pain. There is no evidence of pulmonary embolism.   PAST MEDICAL HISTORY:  Schizoaffective disorder.   SOCIAL HISTORY: Remarkable for smoking but no alcohol or recreational drug use.   FAMILY HISTORY: Negative for premature coronary artery disease.   HOME MEDICATIONS:  Include vitamin D, metformin, lithium and gabapentin.   REVIEW OF SYSTEMS: A 10-point review of systems was performed. It is negative other than what is mentioned in the history of present illness.   PHYSICAL EXAMINATION: GENERAL: The patient appears to be older than her stated age but currently in no acute distress.  VITAL SIGNS: Temperature 98.5, pulse is 109, blood pressure is 93/64 and oxygen saturation is 99% on room air.  HEENT: Normocephalic, atraumatic.  NECK: No jugular venous distention or carotid bruits.  RESPIRATORY: Normal respiratory effort with no use of accessory muscles. Auscultation reveals normal breath sounds.  CARDIOVASCULAR: Normal PMI. Normal S1 and S2 with no gallops or  murmurs.  ABDOMEN: Benign, nontender and nondistended.  EXTREMITIES: With significant swelling in the left leg and tenderness.  PSYCHIATRIC: She is alert, oriented x 3 with normal mood and affect.   LABORATORY AND DIAGNOSTIC DATA: Renal function was normal. Hemoglobin on presentation was 6 and subsequently improved to 8 and now it is 7.5.   IMPRESSION: 1.  Right atrial thrombus, likely due to migration from deep vein thrombosis source.  2.  Extensive bilateral deep vein thrombosis, status post inferior vena cava filter.  3.  Significant anemia.  4.  Schizoaffective disorder.   RECOMMENDATIONS: The patient's right atrial thrombus is coming from her extensive deep vein thrombosis. I recommend continuing anticoagulation. She is significantly anemic. Although she is hemodynamically stable, consider transfusion in order to have some safety margin while she is on full-dose anticoagulation. Obviously, she is at risk of pulmonary embolism, however, I think the risks of catheter-directed intervention outweigh the benefit at the present time. The best option is probably to continue anticoagulation and repeat echocardiogram in a few months to ensure resolution of the thrombus.   ____________________________ Chelsea AusMuhammad A. Kirke CorinArida, MD maa:cs D: 01/13/2014 11:07:00 ET T: 01/13/2014 15:20:32 ET JOB#: [id number]  cc: Muhammad A. Kirke CorinArida, MD, <Dictator> [place] OuchMUHAMMAD A ARIDA MD ELECTRONICALLY SIGNED 01/30/2014 22:33

## 2015-06-21 DEATH — deceased
